# Patient Record
Sex: Female | Born: 1980 | Race: Black or African American | Hispanic: No | Marital: Single | Smoking: Former smoker
Health system: Southern US, Community
[De-identification: ages and names within clinical notes are randomized; demographics above are authoritative.]

## PROBLEM LIST (undated history)

## (undated) DIAGNOSIS — K551 Chronic vascular disorders of intestine: Secondary | ICD-10-CM

## (undated) DIAGNOSIS — Z309 Encounter for contraceptive management, unspecified: Secondary | ICD-10-CM

## (undated) DIAGNOSIS — K649 Unspecified hemorrhoids: Secondary | ICD-10-CM

## (undated) DIAGNOSIS — N898 Other specified noninflammatory disorders of vagina: Secondary | ICD-10-CM

## (undated) DIAGNOSIS — M549 Dorsalgia, unspecified: Secondary | ICD-10-CM

## (undated) DIAGNOSIS — B009 Herpesviral infection, unspecified: Principal | ICD-10-CM

## (undated) DIAGNOSIS — G709 Myoneural disorder, unspecified: Secondary | ICD-10-CM

## (undated) DIAGNOSIS — A749 Chlamydial infection, unspecified: Principal | ICD-10-CM

## (undated) DIAGNOSIS — N9489 Other specified conditions associated with female genital organs and menstrual cycle: Secondary | ICD-10-CM

## (undated) DIAGNOSIS — B9689 Other specified bacterial agents as the cause of diseases classified elsewhere: Secondary | ICD-10-CM

## (undated) DIAGNOSIS — A630 Anogenital (venereal) warts: Secondary | ICD-10-CM

## (undated) DIAGNOSIS — K5900 Constipation, unspecified: Secondary | ICD-10-CM

## (undated) DIAGNOSIS — U071 COVID-19: Secondary | ICD-10-CM

## (undated) DIAGNOSIS — R1032 Left lower quadrant pain: Principal | ICD-10-CM

## (undated) DIAGNOSIS — N760 Acute vaginitis: Secondary | ICD-10-CM

## (undated) HISTORY — DX: Other specified bacterial agents as the cause of diseases classified elsewhere: B96.89

## (undated) HISTORY — DX: Left lower quadrant pain: R10.32

## (undated) HISTORY — DX: Herpesviral infection, unspecified: B00.9

## (undated) HISTORY — DX: Acute vaginitis: N76.0

## (undated) HISTORY — DX: Anogenital (venereal) warts: A63.0

## (undated) HISTORY — DX: Chlamydial infection, unspecified: A74.9

## (undated) HISTORY — DX: Dorsalgia, unspecified: M54.9

## (undated) HISTORY — DX: Constipation, unspecified: K59.00

## (undated) HISTORY — DX: Other specified noninflammatory disorders of vagina: N89.8

## (undated) HISTORY — PX: OTHER SURGICAL HISTORY: SHX169

## (undated) HISTORY — DX: Unspecified hemorrhoids: K64.9

## (undated) HISTORY — DX: Other specified conditions associated with female genital organs and menstrual cycle: N94.89

## (undated) HISTORY — DX: Chronic vascular disorders of intestine: K55.1

## (undated) HISTORY — DX: Encounter for contraceptive management, unspecified: Z30.9

---

## 2006-04-09 ENCOUNTER — Ambulatory Visit (HOSPITAL_COMMUNITY): Admission: AD | Admit: 2006-04-09 | Discharge: 2006-04-09 | Payer: Self-pay | Admitting: Obstetrics and Gynecology

## 2006-04-22 ENCOUNTER — Inpatient Hospital Stay (HOSPITAL_COMMUNITY): Admission: AD | Admit: 2006-04-22 | Discharge: 2006-04-24 | Payer: Self-pay | Admitting: Obstetrics and Gynecology

## 2007-10-29 ENCOUNTER — Emergency Department (HOSPITAL_COMMUNITY): Admission: EM | Admit: 2007-10-29 | Discharge: 2007-10-29 | Payer: Self-pay | Admitting: Emergency Medicine

## 2007-11-06 ENCOUNTER — Encounter: Payer: Self-pay | Admitting: Physician Assistant

## 2008-06-09 ENCOUNTER — Other Ambulatory Visit: Admission: RE | Admit: 2008-06-09 | Discharge: 2008-06-09 | Payer: Self-pay | Admitting: Obstetrics and Gynecology

## 2009-06-29 ENCOUNTER — Other Ambulatory Visit: Admission: RE | Admit: 2009-06-29 | Discharge: 2009-06-29 | Payer: Self-pay | Admitting: Obstetrics and Gynecology

## 2010-08-09 ENCOUNTER — Other Ambulatory Visit: Admission: RE | Admit: 2010-08-09 | Discharge: 2010-08-09 | Payer: Self-pay | Admitting: Obstetrics and Gynecology

## 2011-02-16 NOTE — Op Note (Signed)
NAMEAHRIANNA, SIGLIN             ACCOUNT NO.:  [id number]   MEDICAL RECORD NO.:  [id number]          PATIENT TYPE:  INP   LOCATION:  LDR4                          FACILITY:  [facility]   PHYSICIAN:  Tilda Burrow, M.D. DATE OF BIRTH:  July 21, 1981   DATE OF PROCEDURE:  04/23/2006  DATE OF DISCHARGE:                                 OPERATIVE REPORT   PROCEDURE:  Epidural catheter placement.   DESCRIPTION OF PROCEDURE:  Continuous lumbar epidural catheter placed at L2-  3 interspace on first attempt using loss of resistance technique at a depth  of approximately 5 cm.  Epidural catheter threaded easily 3 cm into the  epidural space.  Continuous infusion of 14 cc per hour initiated.  Bolus was  not administered because the patient's pain level is minimal.   CONDITION POST-PROCEDURE:  Excellent fetal and maternal status.      Tilda Burrow, M.D.  Electronically Signed     JVF/MEDQ  D:  04/23/2006  T:  04/23/2006  Job:  [id number]

## 2011-02-16 NOTE — H&P (Signed)
Jacqueline [name], Jacqueline [name]             ACCOUNT NO.:  [id number]   MEDICAL RECORD NO.:  [id number]          PATIENT TYPE:  INP   LOCATION:  LDR4                          FACILITY:  [facility]   PHYSICIAN:  Tilda Burrow, M.D. DATE OF BIRTH:  06/28/1981   DATE OF ADMISSION:  DATE OF DISCHARGE:  LH                                HISTORY & PHYSICAL   ADMISSION DIAGNOSIS:  Pregnancy, 38+ weeks' gestation. Induction of labor  with cervical favorability. History of rapid labors.   HISTORY:  This 30 year old gravida 2, para 1 has been followed prepregnancy  here through 14 prenatal visits. Cervix last week was checked at 3 cm, 70%,  -1. Reexam today suggests 2 to 3 cm. Presenting part was extremely low.  Approach to the cervix was posterior. The patient requests induction of  labor. She is aware that all of the usual risks of labor can be accompanied  with induced labor including the need for emergency delivery.   PAST MEDICAL HISTORY:  Benign.   PAST SURGICAL HISTORY:  Negative.   ALLERGIES:  PENICILLIN.   SOCIAL HISTORY:  Alcohol, cigarettes and recreational drugs denied.   PRENATAL LABORATORY DATA:  Include blood type O positive, urine drug  negative, rubella immunity present, hemoglobin 11, hematocrit 35. Hepatitis,  HIV, RPR all negative. GC and chlamydia were both positive. The patient and  her partner were treated and were subsequently negative in the first  trimester and then rechecked at 35 weeks, and group B strep was negative at  that time as well. She is admitted for cervical ripening and Pitocin  induction of labor. She plans to have an epidural. Plans to breast fed,  bottle supplement and plans IUD in the future.   PHYSICAL EXAMINATION:  GENERAL:  Shows height 5 foot 11, blood pressure  138/72, weight 195. Healthy, slim, athletic appearing, African-American  female.  HEENT:  Pupils are equal, round, and reactive.  CARDIOVASCULAR:  Unremarkable.  ABDOMEN:  38-cm fundal height.  Term fetus and vertex presentation.   PLAN:  Foley bulb tonight. Pitocin in a.m.     Tilda Burrow, M.D.  Electronically Signed    JVF/MEDQ  D:  04/22/2006  T:  04/22/2006  Job:  [id number]

## 2011-02-16 NOTE — Group Therapy Note (Signed)
[name], [name]             ACCOUNT NO.:  [id number]   MEDICAL RECORD NO.:  [id number]          PATIENT TYPE:  INP   LOCATION:  LDR4                          FACILITY:  [facility]   PHYSICIAN:  Tilda Burrow, M.D. DATE OF BIRTH:  [birth year]   DATE OF PROCEDURE:  DATE OF DISCHARGE:                                   PROGRESS NOTE   Jacqueline [name] progressed very rapidly after her epidural and was noted to be fully  dilated at +3 station.  She had about a 10 minute second stage and had a  spontaneous vaginal delivery of a viable female infant at 44.  The body  delivered without difficulty.  Weight is 7 pounds 5 ounces, Apgars are 9 and  9.  The baby had a very lusty cry prior to delivery of his body which I  always find interesting.  Twenty units Pitocin diluted in 1000 mL lactated  Ringer's was infused rapidly IV. The placenta separated spontaneously and  delivered via controlled cord traction at 0851.  It was inspected and  appears to be intact with a three-vessel cord.  Blood loss was minimal.  Fundus was firm.  The epidural catheter was then removed with the blue tip  visualized as being intact.  She had her epidural for approximately one  hour.  The vagina was inspected and no lacerations were found.  Estimated  blood loss 300 mL.      [name], C.N.M.      Tilda Burrow, M.D.  Electronically Signed    FC/MEDQ  D:  04/23/2006  T:  04/23/2006  Job:  [id number]

## 2011-05-27 ENCOUNTER — Emergency Department (HOSPITAL_COMMUNITY)
Admission: EM | Admit: 2011-05-27 | Discharge: 2011-05-27 | Disposition: A | Discharge status: Home / Self Care | Payer: Medicaid Other | Attending: Emergency Medicine | Admitting: Emergency Medicine

## 2011-05-27 ENCOUNTER — Emergency Department (HOSPITAL_COMMUNITY): Payer: Medicaid Other

## 2011-05-27 DIAGNOSIS — N10 Acute tubulo-interstitial nephritis: Secondary | ICD-10-CM | POA: Insufficient documentation

## 2011-05-27 DIAGNOSIS — F17200 Nicotine dependence, unspecified, uncomplicated: Secondary | ICD-10-CM | POA: Insufficient documentation

## 2011-05-27 LAB — CBC
MCV: 80.5 fL (ref 78.0–100.0)
Platelets: 236 10*3/uL (ref 150–400)
RDW: 13.8 % (ref 11.5–15.5)
WBC: 7.7 10*3/uL (ref 4.0–10.5)

## 2011-05-27 LAB — URINALYSIS, ROUTINE W REFLEX MICROSCOPIC
Glucose, UA: NEGATIVE mg/dL
Specific Gravity, Urine: 1.02 (ref 1.005–1.030)
pH: 7 (ref 5.0–8.0)

## 2011-05-27 LAB — DIFFERENTIAL
Basophils Absolute: 0 10*3/uL (ref 0.0–0.1)
Basophils Relative: 0 % (ref 0–1)
Eosinophils Absolute: 0.1 10*3/uL (ref 0.0–0.7)
Eosinophils Relative: 1 % (ref 0–5)
Lymphocytes Relative: 13 % (ref 12–46)

## 2011-05-27 LAB — COMPREHENSIVE METABOLIC PANEL
ALT: 6 U/L (ref 0–35)
AST: 14 U/L (ref 0–37)
Albumin: 3.9 g/dL (ref 3.5–5.2)
CO2: 24 mEq/L (ref 19–32)
Calcium: 9.5 mg/dL (ref 8.4–10.5)
Sodium: 133 mEq/L — ABNORMAL LOW (ref 135–145)
Total Protein: 7.7 g/dL (ref 6.0–8.3)

## 2011-05-27 LAB — URINE MICROSCOPIC-ADD ON

## 2011-05-27 MED ORDER — ACETAMINOPHEN 325 MG PO TABS
650.0000 mg | ORAL_TABLET | Freq: Once | ORAL | Status: AC
  Administered 2011-05-27: 650 mg via ORAL
  Filled 2011-05-27: qty 2

## 2011-05-27 MED ORDER — SODIUM CHLORIDE 0.9 % IV BOLUS (SEPSIS)
1000.0000 mL | Freq: Once | INTRAVENOUS | Status: AC
  Administered 2011-05-27: 1000 mL via INTRAVENOUS

## 2011-05-27 MED ORDER — ONDANSETRON HCL 4 MG/2ML IJ SOLN
4.0000 mg | Freq: Once | INTRAMUSCULAR | Status: AC
  Administered 2011-05-27: 4 mg via INTRAVENOUS
  Filled 2011-05-27: qty 2

## 2011-05-27 MED ORDER — ONDANSETRON HCL 4 MG PO TABS: 4.0000 mg | ORAL_TABLET | Freq: Four times a day (QID) | ORAL | Status: AC

## 2011-05-27 MED ORDER — CIPROFLOXACIN IN D5W 400 MG/200ML IV SOLN
400.0000 mg | Freq: Two times a day (BID) | INTRAVENOUS | Status: DC
  Administered 2011-05-27: 400 mg via INTRAVENOUS
  Filled 2011-05-27: qty 200

## 2011-05-27 MED ORDER — MORPHINE SULFATE 4 MG/ML IJ SOLN
4.0000 mg | Freq: Once | INTRAMUSCULAR | Status: AC
  Administered 2011-05-27: 4 mg via INTRAVENOUS
  Filled 2011-05-27: qty 1

## 2011-05-27 MED ORDER — OXYCODONE-ACETAMINOPHEN 5-325 MG PO TABS: 1.0000 | ORAL_TABLET | Freq: Four times a day (QID) | ORAL | Status: AC | PRN

## 2011-05-27 MED ORDER — CIPROFLOXACIN HCL 500 MG PO TABS: 500.0000 mg | ORAL_TABLET | Freq: Two times a day (BID) | ORAL | Status: AC

## 2011-05-27 NOTE — ED Notes (Signed)
C/o chills, fever, left side abd and left side back pain, pt states that this started Friday. Denies any urinary symptoms, also c/o vaginal bleeding that is different from her regular periods.

## 2011-05-27 NOTE — ED Notes (Signed)
Pt reports abnormal "breakthrough" menstrual bleeding a few days ago.  Pt denies missing any of her birth control pills.  Pt reports this being out of the ordinary for her.

## 2011-05-27 NOTE — ED Notes (Signed)
Pt reports LUQ pain that radiates to her back, fever, fatigue, and decreased appetite since Friday.  Pt also reports dizziness when she changes positions.

## 2011-05-27 NOTE — ED Provider Notes (Signed)
History     CSN: [id number] Arrival date & time: 05/27/2011  2:31 PM  Chief Complaint  Patient presents with  . Generalized Body Aches  . Abdominal Pain  . Weakness   Patient is a 30 y.o. female presenting with abdominal pain. The history is provided by the patient.  Abdominal Pain The primary symptoms of the illness include abdominal pain and fever. The primary symptoms of the illness do not include fatigue, shortness of breath, nausea, vomiting, diarrhea, dysuria or vaginal discharge. Vaginal bleeding: the other day but it stopped yesterday. The current episode started more than 2 days ago. The onset of the illness was gradual. The problem has not changed since onset. Progression: waxing and waning at times, worse last night better in the am but increased again. The abdominal pain is located in the LUQ. The abdominal pain radiates to the left flank. Pain scale: moderate, worse at night. The abdominal pain is exacerbated by NSAIDs.  Additional symptoms associated with the illness include chills and back pain. Symptoms associated with the illness do not include constipation or urgency.    History reviewed. No pertinent past medical history.  History reviewed. No pertinent past surgical history.  No family history on file.  History  Substance Use Topics  . Smoking status: Current Everyday Smoker  . Smokeless tobacco: Not on file  . Alcohol Use: No    OB History    Grav Para Term Preterm Abortions TAB SAB Ect Mult Living                  Review of Systems  Constitutional: Positive for fever and chills. Negative for fatigue.  Respiratory: Negative for cough and shortness of breath.   Gastrointestinal: Positive for abdominal pain. Negative for nausea, vomiting, diarrhea and constipation.  Genitourinary: Negative for dysuria, urgency and vaginal discharge. Vaginal bleeding: the other day but it stopped yesterday.  Musculoskeletal: Positive for back pain.  All other systems  reviewed and are negative.    Physical Exam  BP 131/48  Pulse 117  Temp(Src) 98.8 F (37.1 C) (Oral)  Resp 20  Ht 6' (1.829 m)  Wt 173 lb (78.472 kg)  BMI 23.46 kg/m2  SpO2 100%  LMP 05/25/2011  Physical Exam  Nursing note and vitals reviewed. Constitutional: She appears well-developed and well-nourished. No distress.  HENT:  Head: Normocephalic and atraumatic.  Right Ear: External ear normal.  Left Ear: External ear normal.  Eyes: Conjunctivae are normal. Right eye exhibits no discharge. Left eye exhibits no discharge. No scleral icterus.  Neck: Neck supple. No tracheal deviation present.  Cardiovascular: Normal rate, regular rhythm and intact distal pulses.   Pulmonary/Chest: Effort normal and breath sounds normal. No stridor. No respiratory distress. She has no wheezes. She has no rales. She exhibits no tenderness.  Abdominal: Soft. Bowel sounds are normal. She exhibits no distension. There is tenderness in the left upper quadrant. There is CVA tenderness (left). There is no rigidity, no rebound and no guarding. No hernia.  Musculoskeletal: She exhibits no edema and no tenderness.  Neurological: She is alert. She has normal strength. No sensory deficit. Cranial nerve deficit:  no gross defecits noted. She exhibits normal muscle tone. She displays no seizure activity. Coordination normal.  Skin: Skin is warm and dry. No rash noted.  Psychiatric: She has a normal mood and affect.    ED Course  Procedures Dg Chest 2 View  05/27/2011  *RADIOLOGY REPORT*f  Clinical Data: Fever.  Left flank pain.f  CHEST - 2 VIEW  Comparison: None.  Findings: Normal sized heart.  Clear lungs.  Normal appearing bones.  IMPRESSION: Normal examination.  Original Report Authenticated By: Darrol Angel, M.D.   Labs Reviewed  CBC - Abnormal; Notable for the following:    Hemoglobin 11.9 (*)    HCT 35.2 (*)    All other components within normal limits  DIFFERENTIAL - Abnormal; Notable for the  following:    Neutrophils Relative 78 (*)    All other components within normal limits  COMPREHENSIVE METABOLIC PANEL - Abnormal; Notable for the following:    Sodium 133 (*)    Glucose, Bld 109 (*)    All other components within normal limits  URINALYSIS, ROUTINE W REFLEX MICROSCOPIC - Abnormal; Notable for the following:    Appearance HAZY (*)    Hgb urine dipstick TRACE (*)    Leukocytes, UA SMALL (*)    All other components within normal limits  URINE MICROSCOPIC-ADD ON - Abnormal; Notable for the following:    Squamous Epithelial / LPF FEW (*)    Bacteria, UA MANY (*)    All other components within normal limits  POCT PREGNANCY, URINE  LIPASE, BLOOD  URINE CULTURE    MDM All labs and radiology studies have been reviewed. Patient appears to have an episode of acute pyelonephritis. At this point she is feeling better after IV hydration and pain medications. IV dose of ciprofloxacin has been given.  Clinical impression: Acute pyelonephritis Plan will be to discharge her home on oral antibiotics and pain medications. She's been counseled to follow up with her doctor and return to emergency room or her doctor if not feeling better within the next couple of days and certainly if the fever does not resolve.      Celene Kras, MD 05/27/11 [phone]

## 2011-05-29 LAB — URINE CULTURE

## 2011-08-16 ENCOUNTER — Other Ambulatory Visit (HOSPITAL_COMMUNITY)
Admission: RE | Admit: 2011-08-16 | Discharge: 2011-08-16 | Disposition: A | Discharge status: Home / Self Care | Payer: Medicaid Other | Source: Ambulatory Visit | Attending: Obstetrics and Gynecology | Admitting: Obstetrics and Gynecology

## 2011-08-16 DIAGNOSIS — Z01419 Encounter for gynecological examination (general) (routine) without abnormal findings: Secondary | ICD-10-CM | POA: Insufficient documentation

## 2011-10-09 ENCOUNTER — Emergency Department: Payer: Self-pay | Admitting: Emergency Medicine

## 2011-10-09 LAB — URINALYSIS, COMPLETE
Bilirubin,UR: NEGATIVE
Glucose,UR: NEGATIVE mg/dL (ref 0–75)
Ketone: NEGATIVE
Leukocyte Esterase: NEGATIVE
Ph: 5 (ref 4.5–8.0)
Protein: NEGATIVE
RBC,UR: 1 /HPF (ref 0–5)
WBC UR: <1 /HPF (ref 0–5)

## 2011-10-09 LAB — WET PREP, GENITAL

## 2011-10-09 LAB — PREGNANCY, URINE: Pregnancy Test, Urine: NEGATIVE m[IU]/mL

## 2012-08-20 ENCOUNTER — Other Ambulatory Visit (HOSPITAL_COMMUNITY)
Admission: RE | Admit: 2012-08-20 | Discharge: 2012-08-20 | Disposition: A | Discharge status: Home / Self Care | Payer: Medicaid Other | Source: Ambulatory Visit | Attending: Obstetrics and Gynecology | Admitting: Obstetrics and Gynecology

## 2012-08-20 DIAGNOSIS — Z3049 Encounter for surveillance of other contraceptives: Secondary | ICD-10-CM | POA: Insufficient documentation

## 2012-08-20 DIAGNOSIS — Z1151 Encounter for screening for human papillomavirus (HPV): Secondary | ICD-10-CM | POA: Insufficient documentation

## 2012-08-20 DIAGNOSIS — Z113 Encounter for screening for infections with a predominantly sexual mode of transmission: Secondary | ICD-10-CM | POA: Insufficient documentation

## 2013-10-01 HISTORY — PX: ENDOSCOPIC RETROGRADE CHOLANGIOPANCREATOGRAPHY (ERCP) WITH PROPOFOL: SHX5810

## 2013-11-13 ENCOUNTER — Ambulatory Visit (INDEPENDENT_AMBULATORY_CARE_PROVIDER_SITE_OTHER): Payer: Medicaid Other | Admitting: Obstetrics and Gynecology

## 2013-11-13 ENCOUNTER — Encounter (INDEPENDENT_AMBULATORY_CARE_PROVIDER_SITE_OTHER): Payer: Self-pay

## 2013-11-13 ENCOUNTER — Telehealth: Payer: Self-pay | Admitting: *Deleted

## 2013-11-13 ENCOUNTER — Encounter: Payer: Self-pay | Admitting: Obstetrics and Gynecology

## 2013-11-13 VITALS — BP 120/80 | Ht 72.0 in | Wt 170.0 lb

## 2013-11-13 DIAGNOSIS — A6000 Herpesviral infection of urogenital system, unspecified: Secondary | ICD-10-CM | POA: Insufficient documentation

## 2013-11-13 DIAGNOSIS — R1013 Epigastric pain: Secondary | ICD-10-CM

## 2013-11-13 DIAGNOSIS — Z3202 Encounter for pregnancy test, result negative: Secondary | ICD-10-CM

## 2013-11-13 DIAGNOSIS — N898 Other specified noninflammatory disorders of vagina: Secondary | ICD-10-CM

## 2013-11-13 DIAGNOSIS — K3189 Other diseases of stomach and duodenum: Secondary | ICD-10-CM

## 2013-11-13 DIAGNOSIS — N940 Mittelschmerz: Secondary | ICD-10-CM

## 2013-11-13 DIAGNOSIS — R109 Unspecified abdominal pain: Secondary | ICD-10-CM

## 2013-11-13 LAB — POCT URINALYSIS DIPSTICK
GLUCOSE UA: NEGATIVE
Ketones, UA: NEGATIVE
Leukocytes, UA: NEGATIVE
Nitrite, UA: NEGATIVE
RBC UA: NEGATIVE

## 2013-11-13 LAB — POCT WET PREP (WET MOUNT)

## 2013-11-13 LAB — POCT URINE PREGNANCY: Preg Test, Ur: NEGATIVE

## 2013-11-13 LAB — HCG, SERUM, QUALITATIVE: Preg, Serum: NEGATIVE

## 2013-11-13 NOTE — Progress Notes (Signed)
This chart was scribed by Bennett Scrapehristina Taylor, Medical Scribe, for Dr. Christin BachJohn Dayna Alia on 11/13/13 at 11:22 AM. This chart was reviewed by Dr. Christin BachJohn Serrita Lueth and is accurate.    [facility] Visit  Patient name: Jacqueline [name] MRN [id number]  Date of birth: 10-23-1980  CC & HPI:  Jacqueline [name] is a 33 y.o. female presenting today for 3 months of worsening intermittent abdominal pain. She reports [facility] [place] [facility] [place] but had [facility] and [facility] States HTN resolved after pregnancy. Reports that she finished breast feeding her newborn when she felt her "uterus contract" and the pain shot down her left arm making her legs felt weak yesterday. Reports feeling pressure in the suprapubic region with standing today. Pain not always triggered by breast feeding. States she constantly breast feeds. G3P3A0. Did not breast feed prior children. H/x herpes. Was treated with suppressants during start of last pregnancy. Reports having a mild "outbreak" currently. States outbreaks are in frequent, last episode was 8 months ago. Not usually treated with medications. No current contraceptive management. One sexual encounter since birth with long term partner last week. No recent STD infections like trichomonas or gonorrhea/chylmydia (last infection was 5 years ago). Reports BV several months before start of last pregnancy. Interested in depo shot.  ROS:  + intermittent abdominal pain, sharp Denies any urinary complaints Denies fevers, chills Denies vaginal discharge or odor   Pertinent History Reviewed:  Medical & Surgical Hx:  Reviewed: Significant for herpes, suppressant medication used for start of pregnancy, currently unmedicated Medications: Reviewed & Updated - see associated section Social History: Reviewed -  reports that she has been smoking.  She does not have any smokeless tobacco history on file.  Objective Findings:  Vitals:  BP 120/80  Ht 6' (1.829 m)  Wt 170 lb (77.111 kg)  BMI 23.05 kg/m2  LMP 10/18/2013 Chaperone present for exam. Exam performed with pt's permission without severe discomfort or complications Physical Examination: General appearance - alert, well appearing, and in no distress and oriented to person, place, and time Mental status - alert, oriented to person, place, and time, normal mood, behavior, speech, dress, motor activity, and thought processes Pelvic - VULVA: normal appearing vulva with no masses, tenderness or lesions, VAGINA: normal appearing vagina with normal color and discharge, no lesions, CERVIX: normal appearing cervix without discharge or lesions, clear mucus Good anterior support Wet prep: rare clue cells, rare sperm cells    Assessment & Plan:  Pain is ovulation related. HcG serum today, depo injection in 3 days

## 2013-11-13 NOTE — Patient Instructions (Signed)

## 2013-11-13 NOTE — Telephone Encounter (Signed)
Depo Provera 150 mg disp 1 vial for IM injection in office every 3 months with 3 refills per Dr. Emel[facility]erguson [facility]. Pt aware. JSY

## 2013-11-16 ENCOUNTER — Ambulatory Visit (INDEPENDENT_AMBULATORY_CARE_PROVIDER_SITE_OTHER): Payer: Medicaid Other | Admitting: Adult Health

## 2013-11-16 ENCOUNTER — Encounter: Payer: Self-pay | Admitting: Adult Health

## 2013-11-16 ENCOUNTER — Telehealth: Payer: Self-pay

## 2013-11-16 ENCOUNTER — Encounter: Payer: Self-pay | Admitting: Obstetrics and Gynecology

## 2013-11-16 VITALS — BP 120/76 | Ht 72.0 in | Wt 170.0 lb

## 2013-11-16 DIAGNOSIS — Z3049 Encounter for surveillance of other contraceptives: Secondary | ICD-10-CM

## 2013-11-16 DIAGNOSIS — Z3202 Encounter for pregnancy test, result negative: Secondary | ICD-10-CM

## 2013-11-16 DIAGNOSIS — Z309 Encounter for contraceptive management, unspecified: Secondary | ICD-10-CM

## 2013-11-16 DIAGNOSIS — Z3200 Encounter for pregnancy test, result unknown: Secondary | ICD-10-CM

## 2013-11-16 LAB — POCT URINE PREGNANCY: Preg Test, Ur: NEGATIVE

## 2013-11-16 MED ORDER — METRONIDAZOLE 500 MG PO TABS
500.0000 mg | ORAL_TABLET | Freq: Two times a day (BID) | ORAL | Status: DC
Start: 2013-11-16 — End: 2013-12-27

## 2013-11-16 MED ORDER — MEDROXYPROGESTERONE ACETATE 150 MG/ML IM SUSP
150.0000 mg | Freq: Once | INTRAMUSCULAR | Status: AC
  Administered 2013-11-16: 150 mg via INTRAMUSCULAR

## 2013-11-16 NOTE — Telephone Encounter (Signed)
RX Metrogel for Bv called in to pharmacy

## 2013-11-19 ENCOUNTER — Telehealth: Payer: Self-pay | Admitting: Obstetrics and Gynecology

## 2013-11-19 NOTE — Telephone Encounter (Signed)
Pt states Dr. Emelda FearFerguson was to e-scribed RX for BV. Pt informed RX for Flagyl e-scribed 11/16/13 to [facility].

## 2013-12-27 ENCOUNTER — Emergency Department (HOSPITAL_COMMUNITY): Payer: Medicaid Other

## 2013-12-27 ENCOUNTER — Inpatient Hospital Stay (HOSPITAL_COMMUNITY)
Admission: EM | Admit: 2013-12-27 | Discharge: 2013-12-31 | DRG: 418 | Disposition: A | Discharge status: Home / Self Care | Payer: Medicaid Other | Attending: General Surgery | Admitting: General Surgery

## 2013-12-27 ENCOUNTER — Inpatient Hospital Stay (HOSPITAL_COMMUNITY): Payer: Medicaid Other

## 2013-12-27 ENCOUNTER — Encounter (HOSPITAL_COMMUNITY): Payer: Self-pay | Admitting: Emergency Medicine

## 2013-12-27 DIAGNOSIS — R109 Unspecified abdominal pain: Secondary | ICD-10-CM | POA: Diagnosis present

## 2013-12-27 DIAGNOSIS — K8064 Calculus of gallbladder and bile duct with chronic cholecystitis without obstruction: Principal | ICD-10-CM | POA: Diagnosis present

## 2013-12-27 DIAGNOSIS — R748 Abnormal levels of other serum enzymes: Secondary | ICD-10-CM | POA: Diagnosis present

## 2013-12-27 DIAGNOSIS — R319 Hematuria, unspecified: Secondary | ICD-10-CM | POA: Diagnosis present

## 2013-12-27 DIAGNOSIS — F17200 Nicotine dependence, unspecified, uncomplicated: Secondary | ICD-10-CM | POA: Diagnosis present

## 2013-12-27 DIAGNOSIS — F3289 Other specified depressive episodes: Secondary | ICD-10-CM | POA: Diagnosis present

## 2013-12-27 DIAGNOSIS — F329 Major depressive disorder, single episode, unspecified: Secondary | ICD-10-CM | POA: Diagnosis present

## 2013-12-27 DIAGNOSIS — K8060 Calculus of gallbladder and bile duct with cholecystitis, unspecified, without obstruction: Principal | ICD-10-CM | POA: Diagnosis present

## 2013-12-27 DIAGNOSIS — R17 Unspecified jaundice: Secondary | ICD-10-CM | POA: Diagnosis present

## 2013-12-27 DIAGNOSIS — K8050 Calculus of bile duct without cholangitis or cholecystitis without obstruction: Secondary | ICD-10-CM

## 2013-12-27 HISTORY — DX: Myoneural disorder, unspecified: G70.9

## 2013-12-27 LAB — URINALYSIS, ROUTINE W REFLEX MICROSCOPIC
GLUCOSE, UA: NEGATIVE mg/dL
Ketones, ur: NEGATIVE mg/dL
LEUKOCYTES UA: NEGATIVE
Nitrite: NEGATIVE
PH: 7 (ref 5.0–8.0)
Protein, ur: 30 mg/dL — AB
Specific Gravity, Urine: 1.025 (ref 1.005–1.030)
Urobilinogen, UA: 1 mg/dL (ref 0.0–1.0)

## 2013-12-27 LAB — URINE MICROSCOPIC-ADD ON

## 2013-12-27 LAB — COMPREHENSIVE METABOLIC PANEL
ALT: 466 U/L — ABNORMAL HIGH (ref 0–35)
AST: 598 U/L — ABNORMAL HIGH (ref 0–37)
Albumin: 4.4 g/dL (ref 3.5–5.2)
Alkaline Phosphatase: 234 U/L — ABNORMAL HIGH (ref 39–117)
BUN: 12 mg/dL (ref 6–23)
CO2: 28 mEq/L (ref 19–32)
Calcium: 9.9 mg/dL (ref 8.4–10.5)
Chloride: 100 mEq/L (ref 96–112)
Creatinine, Ser: 0.66 mg/dL (ref 0.50–1.10)
GFR calc Af Amer: >90 mL/min (ref >90)
GFR calc non Af Amer: >90 mL/min (ref >90)
Glucose, Bld: 90 mg/dL (ref 70–99)
Potassium: 3.3 mEq/L — ABNORMAL LOW (ref 3.7–5.3)
SODIUM: 140 meq/L (ref 137–147)
TOTAL PROTEIN: 8.4 g/dL — AB (ref 6.0–8.3)
Total Bilirubin: 1.9 mg/dL — ABNORMAL HIGH (ref 0.3–1.2)

## 2013-12-27 LAB — CBC WITH DIFFERENTIAL/PLATELET
BASOS ABS: 0 10*3/uL (ref 0.0–0.1)
Basophils Relative: 0 % (ref 0–1)
EOS ABS: 0.3 10*3/uL (ref 0.0–0.7)
Eosinophils Relative: 5 % (ref 0–5)
HCT: 35.5 % — ABNORMAL LOW (ref 36.0–46.0)
Hemoglobin: 11.9 g/dL — ABNORMAL LOW (ref 12.0–15.0)
Lymphocytes Relative: 39 % (ref 12–46)
Lymphs Abs: 2.5 10*3/uL (ref 0.7–4.0)
MCH: 26.5 pg (ref 26.0–34.0)
MCHC: 33.5 g/dL (ref 30.0–36.0)
MCV: 79.1 fL (ref 78.0–100.0)
Monocytes Absolute: 0.8 10*3/uL (ref 0.1–1.0)
Monocytes Relative: 12 % (ref 3–12)
NEUTROS PCT: 44 % (ref 43–77)
Neutro Abs: 2.8 10*3/uL (ref 1.7–7.7)
PLATELETS: 315 10*3/uL (ref 150–400)
RBC: 4.49 MIL/uL (ref 3.87–5.11)
RDW: 14.1 % (ref 11.5–15.5)
WBC: 6.3 10*3/uL (ref 4.0–10.5)

## 2013-12-27 LAB — PREGNANCY, URINE: PREG TEST UR: NEGATIVE

## 2013-12-27 LAB — LIPASE, BLOOD: LIPASE: 24 U/L (ref 11–59)

## 2013-12-27 LAB — BILIRUBIN, DIRECT: BILIRUBIN DIRECT: 1.7 mg/dL — AB (ref 0.0–0.3)

## 2013-12-27 MED ORDER — METRONIDAZOLE IN NACL 5-0.79 MG/ML-% IV SOLN
500.0000 mg | Freq: Once | INTRAVENOUS | Status: AC
  Administered 2013-12-27: 500 mg via INTRAVENOUS
  Filled 2013-12-27: qty 100

## 2013-12-27 MED ORDER — FLUOXETINE HCL 20 MG PO CAPS
20.0000 mg | ORAL_CAPSULE | Freq: Every day | ORAL | Status: DC
  Administered 2013-12-27 – 2013-12-31 (×5): 20 mg via ORAL
  Filled 2013-12-27 (×5): qty 1

## 2013-12-27 MED ORDER — SODIUM CHLORIDE 0.9 % IV SOLN
INTRAVENOUS | Status: DC
  Administered 2013-12-27 – 2013-12-30 (×3): via INTRAVENOUS

## 2013-12-27 MED ORDER — HYDROMORPHONE HCL PF 1 MG/ML IJ SOLN
1.0000 mg | INTRAMUSCULAR | Status: DC | PRN
  Administered 2013-12-27 – 2013-12-30 (×9): 1 mg via INTRAVENOUS
  Filled 2013-12-27 (×9): qty 1

## 2013-12-27 MED ORDER — DEXTROSE 5 % IV SOLN
1.0000 g | Freq: Once | INTRAVENOUS | Status: AC
  Administered 2013-12-27: 1 g via INTRAVENOUS
  Filled 2013-12-27: qty 10

## 2013-12-27 MED ORDER — IOHEXOL 300 MG/ML  SOLN
100.0000 mL | Freq: Once | INTRAMUSCULAR | Status: AC | PRN
  Administered 2013-12-27: 100 mL via INTRAVENOUS

## 2013-12-27 MED ORDER — PANTOPRAZOLE SODIUM 40 MG IV SOLR
40.0000 mg | INTRAVENOUS | Status: DC
  Administered 2013-12-27 – 2013-12-28 (×2): 40 mg via INTRAVENOUS
  Filled 2013-12-27 (×2): qty 40

## 2013-12-27 MED ORDER — ACETAMINOPHEN 325 MG PO TABS
650.0000 mg | ORAL_TABLET | Freq: Four times a day (QID) | ORAL | Status: DC | PRN
  Administered 2013-12-27: 650 mg via ORAL
  Filled 2013-12-27: qty 2

## 2013-12-27 MED ORDER — MORPHINE SULFATE 4 MG/ML IJ SOLN
4.0000 mg | Freq: Once | INTRAMUSCULAR | Status: AC
  Administered 2013-12-27: 4 mg via INTRAVENOUS
  Filled 2013-12-27: qty 1

## 2013-12-27 MED ORDER — ONDANSETRON HCL 4 MG/2ML IJ SOLN
4.0000 mg | Freq: Four times a day (QID) | INTRAMUSCULAR | Status: DC | PRN
  Administered 2013-12-27: 4 mg via INTRAVENOUS
  Filled 2013-12-27 (×3): qty 2

## 2013-12-27 MED ORDER — ONDANSETRON HCL 4 MG/2ML IJ SOLN
4.0000 mg | Freq: Four times a day (QID) | INTRAMUSCULAR | Status: DC | PRN
  Administered 2013-12-28 – 2013-12-30 (×5): 4 mg via INTRAVENOUS
  Filled 2013-12-27 (×3): qty 2

## 2013-12-27 MED ORDER — ESCITALOPRAM OXALATE 10 MG PO TABS
20.0000 mg | ORAL_TABLET | Freq: Every day | ORAL | Status: DC
  Administered 2013-12-28 – 2013-12-31 (×2): 20 mg via ORAL
  Filled 2013-12-27 (×5): qty 2

## 2013-12-27 MED ORDER — SODIUM CHLORIDE 0.9 % IV SOLN: INTRAVENOUS | Status: AC

## 2013-12-27 MED ORDER — ONDANSETRON HCL 4 MG/2ML IJ SOLN
4.0000 mg | Freq: Once | INTRAMUSCULAR | Status: AC
  Administered 2013-12-27: 4 mg via INTRAVENOUS
  Filled 2013-12-27: qty 2

## 2013-12-27 MED ORDER — METRONIDAZOLE IN NACL 5-0.79 MG/ML-% IV SOLN
500.0000 mg | Freq: Three times a day (TID) | INTRAVENOUS | Status: DC
  Administered 2013-12-27 – 2013-12-30 (×9): 500 mg via INTRAVENOUS
  Filled 2013-12-27 (×9): qty 100

## 2013-12-27 MED ORDER — MORPHINE SULFATE 4 MG/ML IJ SOLN
4.0000 mg | INTRAMUSCULAR | Status: DC | PRN
  Administered 2013-12-29 – 2013-12-31 (×2): 4 mg via INTRAVENOUS
  Filled 2013-12-27 (×3): qty 1

## 2013-12-27 MED ORDER — IOHEXOL 300 MG/ML  SOLN
50.0000 mL | Freq: Once | INTRAMUSCULAR | Status: AC | PRN
  Administered 2013-12-27: 50 mL via ORAL

## 2013-12-27 MED ORDER — CIPROFLOXACIN IN D5W 400 MG/200ML IV SOLN
400.0000 mg | Freq: Two times a day (BID) | INTRAVENOUS | Status: DC
  Administered 2013-12-27 – 2013-12-31 (×9): 400 mg via INTRAVENOUS
  Filled 2013-12-27 (×9): qty 200

## 2013-12-27 NOTE — ED Provider Notes (Signed)
CSN: [id number]     Arrival date & time 12/27/13  0034 History   First MD Initiated Contact with Patient 12/27/13 0147     Chief Complaint  Patient presents with  . Abdominal Pain     HPI Patient reports intermittent upper abdominal pain over the past several months since the birth of her child 5 months ago.  No prior history of gallstones.  She reports that today the pain became worse and now her pain is constant located on the right side of her abdomen.  She's had associated nausea and vomiting.  She denies fevers and chills.  She reports her pain is moderate in severity at this time.  No urinary complaints.  No chest pain or shortness of breath.  Her pain is worse by palpation of the right upper quadrant upper ou   Past Medical History  Diagnosis Date  . Herpes simplex without mention of complication    History reviewed. No pertinent past surgical history. No family history on file. History  Substance Use Topics  . Smoking status: Current Every Day Smoker  . Smokeless tobacco: Not on file  . Alcohol Use: No   OB History   Grav Para Term Preterm Abortions TAB SAB Ect Mult Living   3 3             Review of Systems  All other systems reviewed and are negative.      Allergies  Penicillins  Home Medications   Current Outpatient Rx  Name  Route  Sig  Dispense  Refill  . cholecalciferol (VITAMIN D) 1000 UNITS tablet   Oral   Take 1,000 Units by mouth daily.         Marland Kitchen. escitalopram (LEXAPRO) 20 MG tablet   Oral   Take 20 mg by mouth daily.         . ferrous sulfate 325 (65 FE) MG tablet   Oral   Take 325 mg by mouth daily with breakfast.         . FLUoxetine (PROZAC) 20 MG capsule   Oral   Take 20 mg by mouth daily.         . metroNIDAZOLE (FLAGYL) 500 MG tablet   Oral   Take 1 tablet (500 mg total) by mouth 2 (two) times daily.   14 tablet   0    BP 139/79  Pulse 77  Temp(Src) 98 F (36.7 C) (Oral)  Resp 20  Ht 6' (1.829 m)  Wt 174 lb  (78.926 kg)  BMI 23.59 kg/m2  SpO2 99%  LMP 12/13/2013 Physical Exam  Nursing note and vitals reviewed. Constitutional: She is oriented to person, place, and time. She appears well-developed and well-nourished. No distress.  HENT:  Head: Normocephalic and atraumatic.  Eyes: EOM are normal.  Neck: Normal range of motion.  Cardiovascular: Normal rate, regular rhythm and normal heart sounds.   Pulmonary/Chest: Effort normal and breath sounds normal.  Abdominal: Soft. She exhibits no distension.  Right upper quadrant tenderness without guarding or rebound  Musculoskeletal: Normal range of motion.  Neurological: She is alert and oriented to person, place, and time.  Skin: Skin is warm and dry.  Psychiatric: She has a normal mood and affect. Judgment normal.    ED Course  Procedures (including critical care time) Labs Review Labs Reviewed  URINALYSIS, ROUTINE W REFLEX MICROSCOPIC - Abnormal; Notable for the following:    Hgb urine dipstick TRACE (*)    Bilirubin Urine MODERATE (*)  Protein, ur 30 (*)    All other components within normal limits  CBC WITH DIFFERENTIAL - Abnormal; Notable for the following:    Hemoglobin 11.9 (*)    HCT 35.5 (*)    All other components within normal limits  COMPREHENSIVE METABOLIC PANEL - Abnormal; Notable for the following:    Potassium 3.3 (*)    Total Protein 8.4 (*)    AST 598 (*)    ALT 466 (*)    Alkaline Phosphatase 234 (*)    Total Bilirubin 1.9 (*)    All other components within normal limits  URINE MICROSCOPIC-ADD ON - Abnormal; Notable for the following:    Bacteria, UA FEW (*)    All other components within normal limits  PREGNANCY, URINE  LIPASE, BLOOD   Imaging Review Ct Abdomen Pelvis W Contrast  12/27/2013   CLINICAL DATA:  Right upper quadrant pain, elevated liver function tests.  EXAM: CT ABDOMEN AND PELVIS WITH CONTRAST  TECHNIQUE: Multidetector CT imaging of the abdomen and pelvis was performed using the standard  protocol following bolus administration of intravenous contrast.  CONTRAST:  50mL OMNIPAQUE IOHEXOL 300 MG/ML SOLN, OMNIPAQUE IOHEXOL 300 MG/ML SOLN  COMPARISON:  None available for comparison at time of study interpretation.  FINDINGS: Included view of the lung bases are clear. Visualized heart and pericardium are unremarkable.  The liver, spleen, pancreas and adrenal glands are unremarkable.  Gastric antral wall thickening and possible edema, with adjacent gallbladder which shows trace pericholecystic fluid (coronal 31/92). Subcentimeter linear calcifications in the gallbladder may be within the wall or reflect gallstones.  The stomach, small and large bowel are normal in course and caliber without inflammatory changes. Normal appendix. No intraperitoneal free fluid nor free air.  Kidneys are orthotopic, demonstrating symmetric enhancement without nephrolithiasis, hydronephrosis or renal masses. The unopacified ureters are normal in course and caliber. Delayed imaging through the kidneys demonstrates symmetric prompt excretion to the proximal urinary collecting system. Urinary bladder is partially distended and unremarkable.  Great vessels are normal in course and caliber. No lymphadenopathy by CT size criteria. Thickened appearance of the endometrium may reflect secretory phase. The soft tissues and included osseous structures are nonsuspicious.  IMPRESSION: Gastric antral wall thickening could reflect gastritis, however there is associated inflammatory changes of the subjacent gallbladder with trace pericholecystic fluid. If clinical concern for acute cholecystitis, consider right upper quadrant ultrasound.   Electronically Signed   By: Awilda Metro   On: 12/27/2013 05:51  I personally reviewed the imaging tests through PACS system I reviewed available ER/hospitalization records through the EMR    EKG Interpretation None      MDM   Final diagnoses:  Abdominal pain  Elevated liver enzymes     Patient with elevated liver enzymes and some right upper quadrant tenderness.  CT demonstrates possible thickening of the gallbladder wall with some possible pericholecystic fluid.  Patient be started on antibiotics at this time for the possibility of developing cholecystitis.  Patient with significant elevation in her liver enzymes and alkaline phosphatase.  This also could represent common bile duct stone.  Patient will need an ultrasound in the next several hours when ultrasound becomes available.  I spoke with general surgery Dr. Lovell Sheehan who will see the patient in consultation.  He requests the patient be admitted to the hospital by the hospitalist.    Lyanne Co, MD 12/27/13 [phone]

## 2013-12-27 NOTE — ED Notes (Signed)
Pt reporting generalized abdominal pain.  Reports she has had pain for about 5 months since birth of child, but reporting pain worse in past 3 days. Also reporting nausea and vomiting.  Denies urinary symptoms at this time.

## 2013-12-27 NOTE — Progress Notes (Signed)
Utilization review Completed Lavilla Delamora RN BSN   

## 2013-12-27 NOTE — H&P (Signed)
[facility] History and Physical  Jacqueline SpiesShikea L Gene NFA:[id number] DOB: [birth year] DOA: 12/27/2013  Referring physician: EDP PCP: No PCP Per Patient   Chief Complaint: Abdominal pain  HPI: Jacqueline [name] is a 33 y.o. female with PMH of Depression, who is 6months post partum, has had intermittent R sided abd pain for 5-66months, this became worse in the last 4-5days, associated with nausea and vomiting. She also reports low grade fevers. In ER, noted to have Abnormal LFTS and CT with some gall bladder wall thickening.  Review of Systems:  Constitutional:  No weight loss, night sweats, Fevers, chills, fatigue.  HEENT:  No headaches, Difficulty swallowing,Tooth/dental problems,Sore throat,  No sneezing, itching, ear ache, nasal congestion, post nasal drip,  Cardio-vascular:  No chest pain, Orthopnea, PND, swelling in lower extremities, anasarca, dizziness, palpitations  GI:  No heartburn, indigestion, abdominal pain, nausea, vomiting, diarrhea, change in bowel habits, loss of appetite  Resp:  No shortness of breath with exertion or at rest. No excess mucus, no productive cough, No non-productive cough, No coughing up of blood.No change in color of mucus.No wheezing.No chest wall deformity  Skin:  no rash or lesions.  GU:  no dysuria, change in color of urine, no urgency or frequency. No flank pain.  Musculoskeletal:  No joint pain or swelling. No decreased range of motion. No back pain.  Psych:  No change in mood or affect. No depression or anxiety. No memory loss.   Past Medical History  Diagnosis Date  . Herpes simplex without mention of complication    History reviewed. No pertinent past surgical history. Social History:  reports that she has been smoking.  She does not have any smokeless tobacco history on file. She reports that she does not drink alcohol or use illicit drugs.  Allergies  Allergen Reactions  . Penicillins Shortness Of Breath and Rash    History  reviewed. No pertinent family history.   Prior to Admission medications   Medication Sig Start Date End Date Taking? Authorizing Provider  cholecalciferol (VITAMIN D) 1000 UNITS tablet Take 1,000 Units by mouth daily.   Yes Historical Provider, MD  escitalopram (LEXAPRO) 20 MG tablet Take 20 mg by mouth daily.   Yes Historical Provider, MD  ferrous sulfate 325 (65 FE) MG tablet Take 325 mg by mouth daily with breakfast.   Yes Historical Provider, MD  FLUoxetine (PROZAC) 20 MG capsule Take 20 mg by mouth daily.   Yes Historical Provider, MD  metroNIDAZOLE (FLAGYL) 500 MG tablet Take 1 tablet (500 mg total) by mouth 2 (two) times daily. 11/16/13   Tilda BurrowJohn V Ferguson, MD   Physical Exam: Filed Vitals:   12/27/13 0737  BP: 127/58  Pulse: 60  Temp: 97.9 F (36.6 C)  Resp:     BP 127/58  Pulse 60  Temp(Src) 97.9 F (36.6 C) (Oral)  Resp 16  Ht 6' (1.829 m)  Wt 76.2 kg (167 lb 15.9 oz)  BMI 22.78 kg/m2  SpO2 100%  LMP 12/13/2013  General:  Appears calm and comfortable Eyes: PERRL, normal lids, irises & conjunctiva ENT: grossly normal hearing, lips & tongue Neck: no LAD, masses or thyromegaly Cardiovascular: RRR, no m/r/g. No LE edema. Respiratory: CTA bilaterally, no w/r/r. Normal respiratory effort. Abdomen: soft, Mild RUQ tenderness, BS diminished Skin: no rash or induration seen on limited exam Musculoskeletal: grossly normal tone BUE/BLE Psychiatric: grossly normal mood and affect, speech fluent and appropriate Neurologic: grossly non-focal.  Labs on Admission:  Basic Metabolic Panel:  Recent Labs Lab 12/27/13 0154  NA 140  K 3.3*  CL 100  CO2 28  GLUCOSE 90  BUN 12  CREATININE 0.66  CALCIUM 9.9   Liver Function Tests:  Recent Labs Lab 12/27/13 0154  AST 598*  ALT 466*  ALKPHOS 234*  BILITOT 1.9*  PROT 8.4*  ALBUMIN 4.4    Recent Labs Lab 12/27/13 0154  LIPASE 24   No results found for this basename: AMMONIA,  in the last 168  hours CBC:  Recent Labs Lab 12/27/13 0154  WBC 6.3  NEUTROABS 2.8  HGB 11.9*  HCT 35.5*  MCV 79.1  PLT 315   Cardiac Enzymes: No results found for this basename: CKTOTAL, CKMB, CKMBINDEX, TROPONINI,  in the last 168 hours  BNP (last 3 results) No results found for this basename: PROBNP,  in the last 8760 hours CBG: No results found for this basename: GLUCAP,  in the last 168 hours  Radiological Exams on Admission: Ct Abdomen Pelvis W Contrast  12/27/2013   CLINICAL DATA:  Right upper quadrant pain, elevated liver function tests.  EXAM: CT ABDOMEN AND PELVIS WITH CONTRAST  TECHNIQUE: Multidetector CT imaging of the abdomen and pelvis was performed using the standard protocol following bolus administration of intravenous contrast.  CONTRAST:  50mL OMNIPAQUE IOHEXOL 300 MG/ML SOLN, OMNIPAQUE IOHEXOL 300 MG/ML SOLN  COMPARISON:  None available for comparison at time of study interpretation.  FINDINGS: Included view of the lung bases are clear. Visualized heart and pericardium are unremarkable.  The liver, spleen, pancreas and adrenal glands are unremarkable.  Gastric antral wall thickening and possible edema, with adjacent gallbladder which shows trace pericholecystic fluid (coronal 31/92). Subcentimeter linear calcifications in the gallbladder may be within the wall or reflect gallstones.  The stomach, small and large bowel are normal in course and caliber without inflammatory changes. Normal appendix. No intraperitoneal free fluid nor free air.  Kidneys are orthotopic, demonstrating symmetric enhancement without nephrolithiasis, hydronephrosis or renal masses. The unopacified ureters are normal in course and caliber. Delayed imaging through the kidneys demonstrates symmetric prompt excretion to the proximal urinary collecting system. Urinary bladder is partially distended and unremarkable.  Great vessels are normal in course and caliber. No lymphadenopathy by CT size criteria. Thickened  appearance of the endometrium may reflect secretory phase. The soft tissues and included osseous structures are nonsuspicious.  IMPRESSION: Gastric antral wall thickening could reflect gastritis, however there is associated inflammatory changes of the subjacent gallbladder with trace pericholecystic fluid. If clinical concern for acute cholecystitis, consider right upper quadrant ultrasound.   Electronically Signed   By: Awilda Metro   On: 12/27/2013 05:51   US Abdomen Limited Ruq  12/27/2013   CLINICAL DATA:  Abnormal gallbladder on CT.  EXAM: US ABDOMEN LIMITED - RIGHT UPPER QUADRANT  COMPARISON:  CT ABD - PELV W/ CM dated 12/27/2013  FINDINGS: Gallbladder:  Multiple mobile echogenic shadowing foci fill much of the gallbladder, likely stones. Gallbladder wall is thickened at 4 mm. Negative sonographic Murphy's. No pericholecystic fluid.  Common bile duct:  Diameter: Only a small portion of the common bile duct can be visualized and is normal caliber at 3 mm.  Liver:  No focal lesion identified. Within normal limits in parenchymal echogenicity.  IMPRESSION: Multiple shadowing gallstones fill much of the gallbladder lumen. Associated gallbladder wall thickening without sonographic Murphy sign may reflect chronic cholecystitis changes.   Electronically Signed   By: Charlett Nose  M.D.   On: 12/27/2013 10:27    Assessment/Plan  1. Abdominal pain -suspect Acute vs Chronic calculous cholecystitis/cholelithiasis -Surgery consult -NPO, IVF, IV CIpro/Flagyl -supportive care -check RUQ USG  2. Depression -continue anti-depressants  DVT proph: SCDS pending surgery  Code Status:Full Code Family Communication: d/w pt and family at bedside Disposition Plan: inpatient  Time spent:  [employer] [facility] Pager [phone]

## 2013-12-27 NOTE — Consult Note (Signed)
Reason for Consult: Elevated liver enzyme tests, cholelithiasis Referring Physician: Hospitalist  Jacqueline [name] is an 33 y.o. female.  HPI: Patient is a 33 year old black female who is 5 months postpartum who presents with an episode of right upper quadrant abdominal pain and nausea. She was found to have a liver transaminase elevations. CT scan of the abdomen revealed cholelithiasis with possible pericholecystic fluid. Ultrasound the gallbladder done soon after her admission reveals cholelithiasis with a normal common bile duct. She states she did not have any significant abnormalities during her pregnancy. She delivered vaginally at [facility]. She denies any fever, chills, or jaundice. She has been breast-feeding.  Past Medical History  Diagnosis Date  . Herpes simplex without mention of complication     History reviewed. No pertinent past surgical history.  History reviewed. No pertinent family history.  Social History:  reports that she has been smoking.  She does not have any smokeless tobacco history on file. She reports that she does not drink alcohol or use illicit drugs.  Allergies:  Allergies  Allergen Reactions  . Penicillins Shortness Of Breath and Rash    Medications: I have reviewed the patient's current medications.  Results for orders placed during the hospital encounter of 12/27/13 (from the past 48 hour(s))  URINALYSIS, ROUTINE W REFLEX MICROSCOPIC     Status: Abnormal   Collection Time    12/27/13  1:35 AM      Result Value Ref Range   Color, Urine YELLOW  YELLOW   APPearance CLEAR  CLEAR   Specific Gravity, Urine 1.025  1.005 - 1.030   pH 7.0  5.0 - 8.0   Glucose, UA NEGATIVE  NEGATIVE mg/dL   Hgb urine dipstick TRACE (*) NEGATIVE   Bilirubin Urine MODERATE (*) NEGATIVE   Ketones, ur NEGATIVE  NEGATIVE mg/dL   Protein, ur 30 (*) NEGATIVE mg/dL   Urobilinogen, UA 1.0  0.0 - 1.0 mg/dL   Nitrite NEGATIVE  NEGATIVE   Leukocytes, UA NEGATIVE  NEGATIVE   PREGNANCY, URINE     Status: None   Collection Time    12/27/13  1:35 AM      Result Value Ref Range   Preg Test, Ur NEGATIVE  NEGATIVE  URINE MICROSCOPIC-ADD ON     Status: Abnormal   Collection Time    12/27/13  1:35 AM      Result Value Ref Range   Squamous Epithelial / LPF RARE  RARE   WBC, UA 0-2  <3 WBC/hpf   RBC / HPF 3-6  <3 RBC/hpf   Bacteria, UA FEW (*) RARE   Urine-Other MUCOUS PRESENT    CBC WITH DIFFERENTIAL     Status: Abnormal   Collection Time    12/27/13  1:54 AM      Result Value Ref Range   WBC 6.3  4.0 - 10.5 K/uL   RBC 4.49  3.87 - 5.11 MIL/uL   Hemoglobin 11.9 (*) 12.0 - 15.0 g/dL   HCT 35.5 (*) 36.0 - 46.0 %   MCV 79.1  78.0 - 100.0 fL   MCH 26.5  26.0 - 34.0 pg   MCHC 33.5  30.0 - 36.0 g/dL   RDW 14.1  11.5 - 15.5 %   Platelets 315  150 - 400 K/uL   Neutrophils Relative % 44  43 - 77 %   Neutro Abs 2.8  1.7 - 7.7 K/uL   Lymphocytes Relative 39  12 - 46 %   Lymphs Abs 2.5  0.7 - 4.0 K/uL   Monocytes Relative 12  3 - 12 %   Monocytes Absolute 0.8  0.1 - 1.0 K/uL   Eosinophils Relative 5  0 - 5 %   Eosinophils Absolute 0.3  0.0 - 0.7 K/uL   Basophils Relative 0  0 - 1 %   Basophils Absolute 0.0  0.0 - 0.1 K/uL  COMPREHENSIVE METABOLIC PANEL     Status: Abnormal   Collection Time    12/27/13  1:54 AM      Result Value Ref Range   Sodium 140  137 - 147 mEq/L   Potassium 3.3 (*) 3.7 - 5.3 mEq/L   Chloride 100  96 - 112 mEq/L   CO2 28  19 - 32 mEq/L   Glucose, Bld 90  70 - 99 mg/dL   BUN 12  6 - 23 mg/dL   Creatinine, Ser 0.66  0.50 - 1.10 mg/dL   Calcium 9.9  8.4 - 10.5 mg/dL   Total Protein 8.4 (*) 6.0 - 8.3 g/dL   Albumin 4.4  3.5 - 5.2 g/dL   AST 598 (*) 0 - 37 U/L   ALT 466 (*) 0 - 35 U/L   Alkaline Phosphatase 234 (*) 39 - 117 U/L   Total Bilirubin 1.9 (*) 0.3 - 1.2 mg/dL   GFR calc non Af Amer >90  >90 mL/min   GFR calc Af Amer >90  >90 mL/min   Comment: (NOTE)     The eGFR has been calculated using the CKD EPI equation.     This  calculation has not been validated in all clinical situations.     eGFR's persistently <90 mL/min signify possible Chronic Kidney     Disease.  LIPASE, BLOOD     Status: None   Collection Time    12/27/13  1:54 AM      Result Value Ref Range   Lipase 24  11 - 59 U/L    Ct Abdomen Pelvis W Contrast  12/27/2013   CLINICAL DATA:  Right upper quadrant pain, elevated liver function tests.  EXAM: CT ABDOMEN AND PELVIS WITH CONTRAST  TECHNIQUE: Multidetector CT imaging of the abdomen and pelvis was performed using the standard protocol following bolus administration of intravenous contrast.  CONTRAST:  65m OMNIPAQUE IOHEXOL 300 MG/ML SOLN, 108mOMNIPAQUE IOHEXOL 300 MG/ML SOLN  COMPARISON:  None available for comparison at time of study interpretation.  FINDINGS: Included view of the lung bases are clear. Visualized heart and pericardium are unremarkable.  The liver, spleen, pancreas and adrenal glands are unremarkable.  Gastric antral wall thickening and possible edema, with adjacent gallbladder which shows trace pericholecystic fluid (coronal 31/92). Subcentimeter linear calcifications in the gallbladder may be within the wall or reflect gallstones.  The stomach, small and large bowel are normal in course and caliber without inflammatory changes. Normal appendix. No intraperitoneal free fluid nor free air.  Kidneys are orthotopic, demonstrating symmetric enhancement without nephrolithiasis, hydronephrosis or renal masses. The unopacified ureters are normal in course and caliber. Delayed imaging through the kidneys demonstrates symmetric prompt excretion to the proximal urinary collecting system. Urinary bladder is partially distended and unremarkable.  Great vessels are normal in course and caliber. No lymphadenopathy by CT size criteria. Thickened appearance of the endometrium may reflect secretory phase. The soft tissues and included osseous structures are nonsuspicious.  IMPRESSION: Gastric antral wall  thickening could reflect gastritis, however there is associated inflammatory changes of the subjacent gallbladder with trace pericholecystic fluid. If clinical  concern for acute cholecystitis, consider right upper quadrant ultrasound.   Electronically Signed   By: Elon Alas   On: 12/27/2013 05:51   US Abdomen Limited Ruq  12/27/2013   CLINICAL DATA:  Abnormal gallbladder on CT.  EXAM: US ABDOMEN LIMITED - RIGHT UPPER QUADRANT  COMPARISON:  CT ABD - PELV W/ CM dated 12/27/2013  FINDINGS: Gallbladder:  Multiple mobile echogenic shadowing foci fill much of the gallbladder, likely stones. Gallbladder wall is thickened at 4 mm. Negative sonographic Murphy's. No pericholecystic fluid.  Common bile duct:  Diameter: Only a small portion of the common bile duct can be visualized and is normal caliber at 3 mm.  Liver:  No focal lesion identified. Within normal limits in parenchymal echogenicity.  IMPRESSION: Multiple shadowing gallstones fill much of the gallbladder lumen. Associated gallbladder wall thickening without sonographic Murphy sign may reflect chronic cholecystitis changes.   Electronically Signed   By: Rolm Baptise M.D.   On: 12/27/2013 10:27    ROS:  See chart  Blood pressure 127/58, pulse 60, temperature 97.9 F (36.6 C), temperature source Oral, resp. rate 16, height 6' (1.829 m), weight 76.2 kg (167 lb 15.9 oz), last menstrual period 12/13/2013, SpO2 100.00%. Physical Exam: Fatigue black female in no acute distress. Abdomen was soft with minimal tenderness in the right upper quadrant to deep palpation. No hepatosplenomegaly, masses, hernias, or rigidity are noted.  Assessment/Plan: Impression: Abdominal pain and nausea secondary to cholelithiasis. She does have a significant elevation in her transaminases. Have ordered a direct bilirubin to assess for choledocholithiasis. Plan: Would continue current management at this point. May have clear liquid diet. Would like the inflammation of the  liver to be decreased prior to laparoscopic cholecystectomy. This was explained to the patient and family, who understand and agreed to the treatment plan.  Adnan Vanvoorhis A 12/27/2013, 11:39 AM

## 2013-12-28 ENCOUNTER — Inpatient Hospital Stay (HOSPITAL_COMMUNITY): Payer: Medicaid Other

## 2013-12-28 ENCOUNTER — Encounter (HOSPITAL_COMMUNITY): Payer: Self-pay | Admitting: Gastroenterology

## 2013-12-28 DIAGNOSIS — R109 Unspecified abdominal pain: Secondary | ICD-10-CM

## 2013-12-28 DIAGNOSIS — R748 Abnormal levels of other serum enzymes: Secondary | ICD-10-CM

## 2013-12-28 LAB — COMPREHENSIVE METABOLIC PANEL
ALBUMIN: 3.7 g/dL (ref 3.5–5.2)
ALT: 309 U/L — ABNORMAL HIGH (ref 0–35)
AST: 171 U/L — ABNORMAL HIGH (ref 0–37)
Alkaline Phosphatase: 263 U/L — ABNORMAL HIGH (ref 39–117)
BUN: 5 mg/dL — ABNORMAL LOW (ref 6–23)
CO2: 25 mEq/L (ref 19–32)
CREATININE: 0.7 mg/dL (ref 0.50–1.10)
Calcium: 9.5 mg/dL (ref 8.4–10.5)
Chloride: 105 mEq/L (ref 96–112)
GFR calc Af Amer: >90 mL/min (ref >90)
GFR calc non Af Amer: >90 mL/min (ref >90)
Glucose, Bld: 89 mg/dL (ref 70–99)
Potassium: 4.3 mEq/L (ref 3.7–5.3)
Sodium: 140 mEq/L (ref 137–147)
TOTAL PROTEIN: 7.3 g/dL (ref 6.0–8.3)
Total Bilirubin: 1.8 mg/dL — ABNORMAL HIGH (ref 0.3–1.2)

## 2013-12-28 LAB — CBC
HEMATOCRIT: 32.7 % — AB (ref 36.0–46.0)
Hemoglobin: 10.9 g/dL — ABNORMAL LOW (ref 12.0–15.0)
MCH: 26.7 pg (ref 26.0–34.0)
MCHC: 33.3 g/dL (ref 30.0–36.0)
MCV: 80 fL (ref 78.0–100.0)
Platelets: 267 10*3/uL (ref 150–400)
RBC: 4.09 MIL/uL (ref 3.87–5.11)
RDW: 14.2 % (ref 11.5–15.5)
WBC: 4.8 10*3/uL (ref 4.0–10.5)

## 2013-12-28 MED ORDER — GADOBENATE DIMEGLUMINE 529 MG/ML IV SOLN: 15.0000 mL | Freq: Once | INTRAVENOUS | Status: AC | PRN

## 2013-12-28 MED ORDER — PANTOPRAZOLE SODIUM 40 MG PO TBEC
40.0000 mg | DELAYED_RELEASE_TABLET | Freq: Every day | ORAL | Status: DC
  Administered 2013-12-29 – 2013-12-31 (×2): 40 mg via ORAL
  Filled 2013-12-28 (×2): qty 1

## 2013-12-28 NOTE — Care Management Note (Unsigned)
    Page 1 of 1   12/28/2013     3:13:59 PM   CARE MANAGEMENT NOTE 12/28/2013  Patient:  Ronie SpiesMCCOLLUM,Avonell L   Account Number:  [id number]  Date Initiated:  12/28/2013  Documentation initiated by:  Rosemary HolmsOBSON,Makynlee Kressin  Subjective/Objective Assessment:   Pt admitted for cholecystitis, cholelithaisis. Scheduled for Lap Coli 4/1. [facility] Dr. Latricia HeftSusan Burks-Bermudez. [phone]     Action/Plan:   Anticipated DC Date:     Anticipated DC Plan:  HOME/SELF CARE      DC Planning Services  CM consult      Choice offered to / List presented to:             Status of service:   Medicare Important Message given?   (If response is "NO", the following Medicare IM given date fields will be blank) Date Medicare IM given:   Date Additional Medicare IM given:    Discharge Disposition:    Per UR Regulation:    If discussed at Long Length of Stay Meetings, dates discussed:    Comments:  12/28/13 Rosemary HolmsAmy Percell Lamboy RN BSN CM [facility] Requested H&P documentation

## 2013-12-28 NOTE — Consult Note (Signed)
Referring Provider: Dr. Lovell Sheehan Primary Care Physician:  No PCP Per Patient Primary Gastroenterologist:  Dr. Jena Gauss   Date of Admission: 12/27/13 Date of Consultation: 12/28/13  Reason for Consultation:  Choledocholithiasis  HPI:  Jacqueline [name] is a pleasant 33 year old female who presented with intermittent upper abdominal pain since the birth of her child 5 months ago. Elevated transaminases on admission with CT scan revealing gallstones with possible pericholecystic fluid. [place] of abdomen with multiple shadowing gallstones. Tbili elevated at 1.9 on admission with direct 0.7. MRCP then completed showing possible filling defect within most distal common bile duct at level of the ambulla. No significant intrahepatic biliary duct dilation. CBD 6mm. GI consulted by Dr. Lovell Sheehan for consideration of ERCP prior to elective cholecystectomy.   Pain onset about 5 months ago, located RUQ. Usually worse in the evenings. Intermittent. Associated N/V. Worsening in severity. Associated with eating. No hematemesis. No fever or chills. Noted blood in urine on Saturday and Sunday. No urinary burning. Feels improved since admission, pain controlled with narcotics. Breastfeeding. Daughter now receiving formula, but patient is pumping to keep up with milk supply so she may resume when procedures are completed.    Past Medical History  Diagnosis Date  . Herpes simplex without mention of complication     Past Surgical History  Procedure Laterality Date  . None      Prior to Admission medications   Medication Sig Start Date End Date Taking? Authorizing Provider  acyclovir (ZOVIRAX) 400 MG tablet Take 400 mg by mouth 2 (two) times daily.   Yes Historical Provider, MD  cholecalciferol (VITAMIN D) 1000 UNITS tablet Take 1,000 Units by mouth daily.   Yes Historical Provider, MD  escitalopram (LEXAPRO) 20 MG tablet Take 20 mg by mouth daily.   Yes Historical Provider, MD  ferrous sulfate 325 (65 FE) MG  tablet Take 325 mg by mouth daily with breakfast.   Yes Historical Provider, MD  FLUoxetine (PROZAC) 20 MG capsule Take 20 mg by mouth daily.   Yes Historical Provider, MD    Current Facility-Administered Medications  Medication Dose Route Frequency Provider Last Rate Last Dose  . 0.9 %  sodium chloride infusion   Intravenous Continuous Zannie Cove, MD 75 mL/hr at 12/27/13 0756    . acetaminophen (TYLENOL) tablet 650 mg  650 mg Oral Q6H PRN Zannie Cove, MD   650 mg at 12/27/13 1834  . ciprofloxacin (CIPRO) IVPB 400 mg  400 mg Intravenous Q12H Zannie Cove, MD   400 mg at 12/28/13 1108  . escitalopram (LEXAPRO) tablet 20 mg  20 mg Oral Daily Zannie Cove, MD   20 mg at 12/28/13 1108  . FLUoxetine (PROZAC) capsule 20 mg  20 mg Oral Daily Zannie Cove, MD   20 mg at 12/28/13 1108  . gadobenate dimeglumine (MULTIHANCE) injection 15 mL  15 mL Intravenous Once PRN Medication Radiologist, MD      . HYDROmorphone (DILAUDID) injection 1 mg  1 mg Intravenous Q3H PRN Zannie Cove, MD   1 mg at 12/28/13 1107  . metroNIDAZOLE (FLAGYL) IVPB 500 mg  500 mg Intravenous Q8H Zannie Cove, MD   500 mg at 12/28/13 1628  . morphine 4 MG/ML injection 4 mg  4 mg Intravenous Q2H PRN Lyanne Co, MD      . ondansetron [facility]) injection 4 mg  4 mg Intravenous Q6H PRN Lyanne Co, MD   4 mg at 12/27/13 2242  . ondansetron (ZOFRAN) injection 4 mg  4  mg Intravenous Q6H PRN Zannie Cove, MD   4 mg at 12/28/13 1107  . pantoprazole (PROTONIX) injection 40 mg  40 mg Intravenous Q24H Zannie Cove, MD   40 mg at 12/28/13 1107    Allergies as of 12/27/2013 - Review Complete 12/27/2013  Allergen Reaction Noted  . Penicillins Shortness Of Breath and Rash 05/27/2011    Family History  Problem Relation Age of Onset  . Colon cancer Neg Hx     History   Social History  . Marital Status: Single    Spouse Name: N/A    Number of Children: N/A  . Years of Education: N/A   Occupational History  .  Not on file.   Social History Main Topics  . Smoking status: Former Games developer  . Smokeless tobacco: Not on file  . Alcohol Use: No  . Drug Use: No  . Sexual Activity: Yes    Birth Control/ Protection: None   Other Topics Concern  . Not on file   Social History Narrative  . No narrative on file    Review of Systems: As mentioned in HPI.   Physical Exam: Vital signs in last 24 hours: Temp:  [98.3 F (36.8 C)-98.6 F (37 C)] 98.6 F (37 C) (03/30 0541) Pulse Rate:  [58-63] 58 (03/30 1636) Resp:  [20] 20 (03/30 1636) BP: (124-135)/(58-75) 135/58 mmHg (03/30 1636) SpO2:  [99 %-100 %] 100 % (03/30 1636) Last BM Date: 12/26/13 General:   Alert,  Well-developed, well-nourished, pleasant and cooperative in NAD Head:  Normocephalic and atraumatic. Eyes:  Sclera clear, no icterus.   Conjunctiva pink. Ears:  Normal auditory acuity. Nose:  No deformity, discharge,  or lesions. Mouth:  No deformity or lesions, dentition normal. Neck:  Supple; no masses or thyromegaly. Lungs:  Clear throughout to auscultation.   No wheezes, crackles, or rhonchi. No acute distress. Heart:  Regular rate and rhythm; no murmurs, clicks, rubs,  or gallops. Abdomen:  Soft, mild TTP RUQ and nondistended. No masses, hepatosplenomegaly or hernias noted. Normal bowel sounds, without guarding, and without rebound.   Rectal:  Deferred  Msk:  Symmetrical without gross deformities. Normal posture. Extremities:  Without clubbing or edema. Neurologic:  Alert and  oriented x4;  grossly normal neurologically. Skin:  Intact without significant lesions or rashes. Cervical Nodes:  No significant cervical adenopathy. Psych:  Alert and cooperative. Normal mood and affect.  Intake/Output from previous day: 03/29 0701 - 03/30 0700 In: 1665 [P.O.:240; I.V.:825; IV Piggyback:600] Out: 500 [Urine:500] Intake/Output this shift:    Lab Results:  Recent Labs  12/27/13 0154 12/28/13 0640  WBC 6.3 4.8  HGB 11.9* 10.9*   HCT 35.5* 32.7*  PLT 315 267   BMET  Recent Labs  12/27/13 0154 12/28/13 0640  NA 140 140  K 3.3* 4.3  CL 100 105  CO2 28 25  GLUCOSE 90 89  BUN 12 5*  CREATININE 0.66 0.70  CALCIUM 9.9 9.5   LFT  Recent Labs  12/27/13 0154 12/27/13 1239 12/28/13 0640  PROT 8.4*  --  7.3  ALBUMIN 4.4  --  3.7  AST 598*  --  171*  ALT 466*  --  309*  ALKPHOS 234*  --  263*  BILITOT 1.9*  --  1.8*  BILIDIR  --  1.7*  --     Studies/Results: Ct Abdomen Pelvis W Contrast  12/27/2013   CLINICAL DATA:  Right upper quadrant pain, elevated liver function tests.  EXAM: CT ABDOMEN AND PELVIS WITH  CONTRAST  TECHNIQUE: Multidetector CT imaging of the abdomen and pelvis was performed using the standard protocol following bolus administration of intravenous contrast.  CONTRAST:  50mL OMNIPAQUE IOHEXOL 300 MG/ML SOLN, OMNIPAQUE IOHEXOL 300 MG/ML SOLN  COMPARISON:  None available for comparison at time of study interpretation.  FINDINGS: Included view of the lung bases are clear. Visualized heart and pericardium are unremarkable.  The liver, spleen, pancreas and adrenal glands are unremarkable.  Gastric antral wall thickening and possible edema, with adjacent gallbladder which shows trace pericholecystic fluid (coronal 31/92). Subcentimeter linear calcifications in the gallbladder may be within the wall or reflect gallstones.  The stomach, small and large bowel are normal in course and caliber without inflammatory changes. Normal appendix. No intraperitoneal free fluid nor free air.  Kidneys are orthotopic, demonstrating symmetric enhancement without nephrolithiasis, hydronephrosis or renal masses. The unopacified ureters are normal in course and caliber. Delayed imaging through the kidneys demonstrates symmetric prompt excretion to the proximal urinary collecting system. Urinary bladder is partially distended and unremarkable.  Great vessels are normal in course and caliber. No lymphadenopathy by CT size  criteria. Thickened appearance of the endometrium may reflect secretory phase. The soft tissues and included osseous structures are nonsuspicious.  IMPRESSION: Gastric antral wall thickening could reflect gastritis, however there is associated inflammatory changes of the subjacent gallbladder with trace pericholecystic fluid. If clinical concern for acute cholecystitis, consider right upper quadrant ultrasound.   Electronically Signed   By: Awilda Metro   On: 12/27/2013 05:51   Mr Abdomen Mrcp Wo Cm  12/28/2013   CLINICAL DATA:  Gallstones. Abdominal pain and nausea. Breasfeeding patient. Gadolinium was avoided.  EXAM: MRI ABDOMEN WITHOUT  (INCLUDING MRCP)  TECHNIQUE: Multiplanar multisequence MR imaging of the abdomen was performed. Heavily T2-weighted images of the biliary and pancreatic ducts were obtained, and three-dimensional MRCP images were rendered by post processing.  COMPARISON:  No comparisons  FINDINGS: MRCP sequences demonstrate multiple small gallstones within the fundus of the gallbladder. Stent measure approximately 3-5 mm each and greater than 10 in number. There is a potential filling defect within the most distal common bile duct at the level of the ampulla measuring 5 mm which may represent a distal common bile duct stone (image 73, series 14). There is no significant intrahepatic biliary duct dilatation. The common bile duct is upper limits of normal at 6 mm. The pancreatic duct is normal. No evidence gallbladder inflammation. The gallbladder is upper limits of normal at 4 cm in diameter.  There small bilateral pleural effusions.  No focal hepatic lesion.  There is no pancreatic abnormality on this noncontrast exam.  The spleen, adrenal glands, kidneys appear normal.  IMPRESSION: 1. Multiple gallstones within the lumen of the gallbladder. 2. A potential stone within the most distal common bile duct. 3. Common bile duct is upper limits of normal in diameter. No intrahepatic biliary duct  dilatation. 4. No evidence of pancreatic inflammation.   Electronically Signed   By: Genevive Bi M.D.   On: 12/28/2013 11:59   Mr 3d Recon At Scanner  12/28/2013   CLINICAL DATA:  Gallstones. Abdominal pain and nausea. Breasfeeding patient. Gadolinium was avoided.  EXAM: MRI ABDOMEN WITHOUT  (INCLUDING MRCP)  TECHNIQUE: Multiplanar multisequence MR imaging of the abdomen was performed. Heavily T2-weighted images of the biliary and pancreatic ducts were obtained, and three-dimensional MRCP images were rendered by post processing.  COMPARISON:  No comparisons  FINDINGS: MRCP sequences demonstrate multiple small gallstones within the fundus of  the gallbladder. Stent measure approximately 3-5 mm each and greater than 10 in number. There is a potential filling defect within the most distal common bile duct at the level of the ampulla measuring 5 mm which may represent a distal common bile duct stone (image 73, series 14). There is no significant intrahepatic biliary duct dilatation. The common bile duct is upper limits of normal at 6 mm. The pancreatic duct is normal. No evidence gallbladder inflammation. The gallbladder is upper limits of normal at 4 cm in diameter.  There small bilateral pleural effusions.  No focal hepatic lesion.  There is no pancreatic abnormality on this noncontrast exam.  The spleen, adrenal glands, kidneys appear normal.  IMPRESSION: 1. Multiple gallstones within the lumen of the gallbladder. 2. A potential stone within the most distal common bile duct. 3. Common bile duct is upper limits of normal in diameter. No intrahepatic biliary duct dilatation. 4. No evidence of pancreatic inflammation.   Electronically Signed   By: Genevive BiStewart  Edmunds M.D.   On: 12/28/2013 11:59   [place] Abdomen Limited Ruq  12/27/2013   CLINICAL DATA:  Abnormal gallbladder on CT.  EXAM: US ABDOMEN LIMITED - RIGHT UPPER QUADRANT  COMPARISON:  CT ABD - PELV W/ CM dated 12/27/2013  FINDINGS: Gallbladder:  Multiple mobile  echogenic shadowing foci fill much of the gallbladder, likely stones. Gallbladder wall is thickened at 4 mm. Negative sonographic Murphy's. No pericholecystic fluid.  Common bile duct:  Diameter: Only a small portion of the common bile duct can be visualized and is normal caliber at 3 mm.  Liver:  No focal lesion identified. Within normal limits in parenchymal echogenicity.  IMPRESSION: Multiple shadowing gallstones fill much of the gallbladder lumen. Associated gallbladder wall thickening without sonographic Murphy sign may reflect chronic cholecystitis changes.   Electronically Signed   By: Charlett NoseKevin  Dover M.D.   On: 12/27/2013 10:27    Impression: 33 year old female admitted with several month history of RUQ pain and found to have cholelithiasis with question of chronic cholecystitis; due to mildly elevated Tbili, direct bilirubin and MRCP ordered with a potential stone noted in distal common bile duct. Gastric wall thickening also noted on CT. Transaminases overall improving from admission. Cholecystectomy tentatively planned for 4/1. Will review with Dr. Jena Gaussourk; discussed with patient likely need for ERCP with sphincterotomy and stone extraction. Risks and benefits discussed with patient and family at bedside. Patient currently on Cipro and Flagyl empirically.   Plan: Continue clear liquid diet, NPO after midnight Continue Protonix for GI prophylaxis, change to oral Supportive measures Review MRCP with Dr. Jena Gaussourk; likely will need ERCP with sphincterotomy and stone extraction on 3/31.   Nira RetortAnna W. Sams, ANP-BC [facility]      LOS: 1 day    12/28/2013, 4:49 PM  Attending note:  Patient seen and examined MRI films reviewed. MRI is suspicious for fluid meniscus/distal common bile duct stone. I discussed the approach of ERCP,  the potential risks, including a 1 in 10 chance of pancreatitis, benefits, limitations and imponderables.. Would like to reassess tomorrow morning, repeat  LFTs and discuss MRI further with the radiologist.

## 2013-12-28 NOTE — Progress Notes (Signed)
Due to elevated direct bilirubin, will get MRCP to further evaluate the common bile duct. Will transfer patient to my service. Further management pending MRCP results.

## 2013-12-29 ENCOUNTER — Encounter (HOSPITAL_COMMUNITY): Payer: Medicaid Other | Admitting: Anesthesiology

## 2013-12-29 ENCOUNTER — Inpatient Hospital Stay (HOSPITAL_COMMUNITY): Payer: Medicaid Other | Admitting: Anesthesiology

## 2013-12-29 ENCOUNTER — Encounter (HOSPITAL_COMMUNITY)
Admission: EM | Disposition: A | Discharge status: Home / Self Care | Payer: Self-pay | Source: Home / Self Care | Attending: General Surgery

## 2013-12-29 ENCOUNTER — Inpatient Hospital Stay (HOSPITAL_COMMUNITY): Payer: Medicaid Other

## 2013-12-29 ENCOUNTER — Encounter (HOSPITAL_COMMUNITY): Payer: Self-pay | Admitting: *Deleted

## 2013-12-29 DIAGNOSIS — K8051 Calculus of bile duct without cholangitis or cholecystitis with obstruction: Secondary | ICD-10-CM

## 2013-12-29 HISTORY — PX: SPHINCTEROTOMY: SHX5544

## 2013-12-29 HISTORY — PX: ERCP: SHX5425

## 2013-12-29 LAB — HEPATIC FUNCTION PANEL
ALBUMIN: 3.7 g/dL (ref 3.5–5.2)
ALK PHOS: 251 U/L — AB (ref 39–117)
ALT: 221 U/L — AB (ref 0–35)
AST: 72 U/L — ABNORMAL HIGH (ref 0–37)
Bilirubin, Direct: 0.2 mg/dL (ref 0.0–0.3)
Indirect Bilirubin: 0.5 mg/dL (ref 0.3–0.9)
TOTAL PROTEIN: 7.4 g/dL (ref 6.0–8.3)
Total Bilirubin: 0.7 mg/dL (ref 0.3–1.2)

## 2013-12-29 SURGERY — ERCP, WITH INTERVENTION IF INDICATED
Anesthesia: General

## 2013-12-29 MED ORDER — FENTANYL CITRATE 0.05 MG/ML IJ SOLN
INTRAMUSCULAR | Status: DC | PRN
  Administered 2013-12-29 (×4): 50 ug via INTRAVENOUS

## 2013-12-29 MED ORDER — SUCCINYLCHOLINE CHLORIDE 20 MG/ML IJ SOLN
INTRAMUSCULAR | Status: DC | PRN
  Administered 2013-12-29: 140 mg via INTRAVENOUS

## 2013-12-29 MED ORDER — MIDAZOLAM HCL 5 MG/5ML IJ SOLN
INTRAMUSCULAR | Status: DC | PRN
  Administered 2013-12-29: 2 mg via INTRAVENOUS

## 2013-12-29 MED ORDER — ONDANSETRON HCL 4 MG/2ML IJ SOLN
INTRAMUSCULAR | Status: AC
  Filled 2013-12-29: qty 2

## 2013-12-29 MED ORDER — LIDOCAINE HCL (PF) 1 % IJ SOLN
INTRAMUSCULAR | Status: AC
  Filled 2013-12-29: qty 5

## 2013-12-29 MED ORDER — SUCCINYLCHOLINE CHLORIDE 20 MG/ML IJ SOLN
INTRAMUSCULAR | Status: AC
Start: 2013-12-29 — End: 2013-12-29
  Filled 2013-12-29: qty 1

## 2013-12-29 MED ORDER — MIDAZOLAM HCL 5 MG/5ML IJ SOLN
INTRAMUSCULAR | Status: AC
  Filled 2013-12-29: qty 5

## 2013-12-29 MED ORDER — ONDANSETRON HCL 4 MG/2ML IJ SOLN
4.0000 mg | Freq: Once | INTRAMUSCULAR | Status: AC | PRN
  Administered 2013-12-29: 4 mg via INTRAVENOUS

## 2013-12-29 MED ORDER — SODIUM CHLORIDE 0.9 % IJ SOLN
INTRAMUSCULAR | Status: AC
  Filled 2013-12-29: qty 10

## 2013-12-29 MED ORDER — GLYCOPYRROLATE 0.2 MG/ML IJ SOLN
INTRAMUSCULAR | Status: AC
  Filled 2013-12-29: qty 1

## 2013-12-29 MED ORDER — SODIUM CHLORIDE 0.9 % IV SOLN
INTRAVENOUS | Status: AC
  Filled 2013-12-29: qty 100

## 2013-12-29 MED ORDER — MIDAZOLAM HCL 2 MG/2ML IJ SOLN
INTRAMUSCULAR | Status: AC
  Filled 2013-12-29: qty 2

## 2013-12-29 MED ORDER — SODIUM CHLORIDE 0.9 % IV SOLN
INTRAVENOUS | Status: DC | PRN
  Administered 2013-12-29: 12:00:00

## 2013-12-29 MED ORDER — NEOSTIGMINE METHYLSULFATE 1 MG/ML IJ SOLN
INTRAMUSCULAR | Status: DC | PRN
  Administered 2013-12-29: 3 mg via INTRAVENOUS

## 2013-12-29 MED ORDER — PROPOFOL 10 MG/ML IV BOLUS
INTRAVENOUS | Status: AC
  Filled 2013-12-29: qty 20

## 2013-12-29 MED ORDER — LACTATED RINGERS IV SOLN
INTRAVENOUS | Status: DC
  Administered 2013-12-29: 11:00:00 via INTRAVENOUS

## 2013-12-29 MED ORDER — STERILE WATER FOR IRRIGATION IR SOLN
Status: DC | PRN
  Administered 2013-12-29: 12:00:00

## 2013-12-29 MED ORDER — LIDOCAINE HCL 1 % IJ SOLN
INTRAMUSCULAR | Status: DC | PRN
  Administered 2013-12-29: 50 mg via INTRADERMAL

## 2013-12-29 MED ORDER — GLYCOPYRROLATE 0.2 MG/ML IJ SOLN
INTRAMUSCULAR | Status: DC | PRN
  Administered 2013-12-29 (×2): 0.2 mg via INTRAVENOUS

## 2013-12-29 MED ORDER — FENTANYL CITRATE 0.05 MG/ML IJ SOLN
INTRAMUSCULAR | Status: AC
  Filled 2013-12-29: qty 2

## 2013-12-29 MED ORDER — GLYCOPYRROLATE 0.2 MG/ML IJ SOLN
INTRAMUSCULAR | Status: AC
  Filled 2013-12-29: qty 2

## 2013-12-29 MED ORDER — MIDAZOLAM HCL 2 MG/2ML IJ SOLN
1.0000 mg | Freq: Once | INTRAMUSCULAR | Status: AC
Start: 2013-12-29 — End: 2013-12-29
  Administered 2013-12-29: 1 mg via INTRAVENOUS

## 2013-12-29 MED ORDER — GLUCAGON HCL (RDNA) 1 MG IJ SOLR
INTRAMUSCULAR | Status: AC
  Filled 2013-12-29: qty 2

## 2013-12-29 MED ORDER — SODIUM CHLORIDE 0.9 % IV SOLN
INTRAVENOUS | Status: DC
Start: 2013-12-29 — End: 2013-12-30

## 2013-12-29 MED ORDER — MIDAZOLAM HCL 2 MG/2ML IJ SOLN
1.0000 mg | INTRAMUSCULAR | Status: DC | PRN
  Administered 2013-12-29: 2 mg via INTRAVENOUS
  Filled 2013-12-29: qty 2

## 2013-12-29 MED ORDER — GLYCOPYRROLATE 0.2 MG/ML IJ SOLN
0.2000 mg | Freq: Once | INTRAMUSCULAR | Status: AC
  Administered 2013-12-29: 0.2 mg via INTRAVENOUS

## 2013-12-29 MED ORDER — CHLORHEXIDINE GLUCONATE 4 % EX LIQD
1.0000 "application " | Freq: Once | CUTANEOUS | Status: AC
  Administered 2013-12-29: 1 via TOPICAL
  Filled 2013-12-29: qty 15

## 2013-12-29 MED ORDER — FENTANYL CITRATE 0.05 MG/ML IJ SOLN
25.0000 ug | INTRAMUSCULAR | Status: DC | PRN
  Administered 2013-12-29: 50 ug via INTRAVENOUS

## 2013-12-29 MED ORDER — ROCURONIUM BROMIDE 100 MG/10ML IV SOLN
INTRAVENOUS | Status: DC | PRN
  Administered 2013-12-29: 15 mg via INTRAVENOUS

## 2013-12-29 MED ORDER — PROPOFOL 10 MG/ML IV BOLUS
INTRAVENOUS | Status: DC | PRN
  Administered 2013-12-29: 150 mg via INTRAVENOUS

## 2013-12-29 MED ORDER — ONDANSETRON HCL 4 MG/2ML IJ SOLN
4.0000 mg | Freq: Once | INTRAMUSCULAR | Status: AC
  Administered 2013-12-29: 4 mg via INTRAVENOUS

## 2013-12-29 SURGICAL SUPPLY — 27 items
BALLN RETRIEVAL 12X15 (BALLOONS) ×2 IMPLANT
BALLN RETRIEVAL 12X15MM (BALLOONS) ×1
BASKET TRAPEZOID 3X6 (MISCELLANEOUS) IMPLANT
BASKET TRAPEZOID LITHO 2.5X5 (MISCELLANEOUS) IMPLANT
BLOCK BITE 60FR ADLT L/F BLUE (MISCELLANEOUS) ×3 IMPLANT
DEVICE INFLATION ENCORE 26 (MISCELLANEOUS) IMPLANT
DEVICE LOCKING W-BIOPSY CAP (MISCELLANEOUS) ×3 IMPLANT
FLOOR PAD 36X40 (MISCELLANEOUS) ×3
GUIDEWIRE HYDRA JAGWIRE .35 (WIRE) IMPLANT
GUIDEWIRE JAG HINI 025X260CM (WIRE) IMPLANT
KIT CLEAN ENDO COMPLIANCE (KITS) ×3 IMPLANT
NEEDLE HYPO 18GX1.5 BLUNT FILL (NEEDLE) ×3 IMPLANT
PAD FLOOR 36X40 (MISCELLANEOUS) ×1 IMPLANT
SNARE ROTATE MED OVAL 20MM (MISCELLANEOUS) IMPLANT
SNARE SHORT THROW 13M SML OVAL (MISCELLANEOUS) ×3 IMPLANT
SNARE SHORT THROW 27M MED OVAL (MISCELLANEOUS) IMPLANT
SPHINCTEROTOME AUTOTOME .25 (MISCELLANEOUS) ×3 IMPLANT
SPHINCTEROTOME HYDRATOME 44 (MISCELLANEOUS) ×3 IMPLANT
SPONGE GAUZE 4X4 12PLY (GAUZE/BANDAGES/DRESSINGS) ×3 IMPLANT
SYR 20CC LL (SYRINGE) IMPLANT
SYR 3ML LL SCALE MARK (SYRINGE) IMPLANT
SYR 50ML LL SCALE MARK (SYRINGE) ×6 IMPLANT
SYSTEM CONTINUOUS INJECTION (MISCELLANEOUS) ×3 IMPLANT
TUBING ENDO SMARTCAP PENTAX (MISCELLANEOUS) ×3 IMPLANT
WALLSTENT METAL COVERED 10X60 (STENTS) IMPLANT
WALLSTENT METAL COVERED 10X80 (STENTS) IMPLANT
WATER STERILE IRR 1000ML POUR (IV SOLUTION) ×6 IMPLANT

## 2013-12-29 NOTE — Transfer of Care (Signed)
Immediate Anesthesia Transfer of Care Note  Patient: Jacqueline SpiesShikea L [name]  Procedure(s) Performed: Procedure(s) with comments: ENDOSCOPIC RETROGRADE CHOLANGIOPANCREATOGRAPHY (ERCP) (N/A) - and stone extraction SPHINCTEROTOMY (N/A)  Patient Location: PACU  Anesthesia Type:General  Level of Consciousness: awake and patient cooperative  Airway & Oxygen Therapy: Patient Spontanous Breathing and Patient connected to face mask oxygen  Post-op Assessment: Report given to PACU RN, Post -op Vital signs reviewed and stable and Patient moving all extremities  Post vital signs: Reviewed and stable  Complications: No apparent anesthesia complications

## 2013-12-29 NOTE — Progress Notes (Signed)
Awake. Crying. Moaning/groaning. Restless. Yelling. Oriented to place per nurse. Reassurance given. C/O throat pain.

## 2013-12-29 NOTE — Progress Notes (Signed)
Awake. Restless. Crying. States "I am nervous." Reassurance given. Med as noted.

## 2013-12-29 NOTE — Op Note (Signed)
Jacqueline [name], Jacqueline [name]             ACCOUNT NO.:  [id number]  MEDICAL RECORD NO.:  [id number]  LOCATION:  [facility]                          FACILITY:  [facility]  PHYSICIAN:  R. Roetta Sessions, MD FACP FACGDATE OF BIRTH:  [birth year]  DATE OF PROCEDURE:  12/29/2013 DATE OF DISCHARGE:                              OPERATIVE REPORT   PROCEDURE:  Endoscopic retrograde cholangiopancreatogram with biliary sphincterotomy, balloon stone extraction.  INDICATIONS FOR PROCEDURE:  This is a pleasant 33 year old lady, admitted to hospital with biliary colic, has cholelithiasis and common duct stone in the distal aspect of a mildly dilated bile duct on MRCP. LFTs are elevated.  ERCP now being performed.  This approach has discussed with the patient yesterday and again this morning at the bedside.  Potential risks, benefits, limitations, alternatives, imponderables have been discussed, specifically talked about 1:10 chance of pancreatitis, perforation, reaction to medications, and remote need for a stent, etc.  Questions have been answered.  All parties agreeable. The patient was on IV Cipro and Flagyl.  PROCEDURE NOTE:  O2 saturation, blood pressure, pulse, respirations monitored throughout the entire procedure.  ANESTHESIA:  Endotracheal induced by Dr. Marcos Eke and associates.  The patient was placed in the semiprone position on the OR table.  INSTRUMENTATION:  Pentax video chip duodenoscope.  FINDINGS:  Cursory examination of the distal esophagus and stomach revealed no abnormalities.  Pylorus had been easily traversed.  The scope was advanced to on the second portion of the duodenum and was pulled back to the short position 55 cm from the incisors.  The ampulla of Vater was readily identified on the medial wall up to second portion of the duodenum.  Scout film was taken.  Subsequently, using the Microvasive sphincterotome, the ampulla was approached.  With guidewire palpation, I achieved almost  immediate deep biliary cannulation. Cholangiogram was performed.  There appeared to be a small filling defect in the very distal common bile duct.  The bile duct appeared to be mildly dilated at 5-6 mm.  Keeping the safety wire deep in the biliary tree, the sphincterotome was pulled across the ampullary orifice, and at the 12 o'clock position, using the Erbe unit, approximately 1 cm sphincterotomy was performed without difficulty or apparent complication.  The sphincterotome was railed off the wire, an extractor balloon was railed to the confluence and inflated to accommodate the size of the bile duct.  A balloon occlusion cholangiogram was performed.  With this maneuver, small pieces of gravel were recovered.  There was some impediment to flow of bile through the ampullary orifice, this maneuver was repeated with recovery of some more gravel and there was a cholesterol stone which appeared at the ampullary orifice.  With simply suctioning ith the duodenoscope, the stone fell into the duodenal lumen.  After this maneuver was performed, the bile duct was seen to deliver copious, nearly black colored bile through the orifice.  There was excellent drainage and fluoroscopically the biliary tree appeared to drain very well.  There were no persistent filling defects.  There were no strictures seen.  The pancreatic duct was not manipulated or injected.  The patient tolerated the procedure well. Fluoroscopy time 2 minutes.  IMPRESSION: 1. Normal  ampulla. 2. Choledocholithiasis, status post biliary sphincterotomy and balloon     stone extraction.  RECOMMENDATIONS: 1. Clear liquid diet. 2. Repeat LFTs tomorrow morning. 3. N.p.o. past midnight, tonight in anticipation of a laparoscopic     cholecystectomy tomorrow.     R. Michael Airen Stiehl, MD Jacqueline [name]    RMR/MEDQ  D:  12/29/2013  T:  12/29/2013  Job:  [id number]  cc:   Dalia HeadingMark A. Jenkins, M.D. Fax: [phone]

## 2013-12-29 NOTE — Progress Notes (Signed)
Reviewed MRCP images were Dr. Jena GaussMaxwell this morning. There is felt to be a stone likely ball valving in the distal common bile duct seen on this imaging study with a high level of confidence.  LFTs have improved but are not normal Given compelling MRCP findings including a dilated bile duct, we'll plan to proceed with an ERCP to clear the duct later today

## 2013-12-29 NOTE — Anesthesia Preprocedure Evaluation (Signed)
Anesthesia Evaluation  Patient identified by MRN, date of birth, ID band Patient awake    Reviewed: Allergy & Precautions, H&P , NPO status , Patient's Chart, lab work & pertinent test results  Airway Mallampati: II TM Distance: >3 FB Neck ROM: Full    Dental  (+) Teeth Intact   Pulmonary former smoker,  breath sounds clear to auscultation        Cardiovascular negative cardio ROS  Rhythm:Regular Rate:Normal     Neuro/Psych Depression    GI/Hepatic negative GI ROS,   Endo/Other    Renal/GU      Musculoskeletal   Abdominal   Peds  Hematology   Anesthesia Other Findings   Reproductive/Obstetrics                           Anesthesia Physical Anesthesia Plan  ASA: II  Anesthesia Plan: General   Post-op Pain Management:    Induction: Intravenous  Airway Management Planned: Oral ETT  Additional Equipment:   Intra-op Plan:   Post-operative Plan: Extubation in OR  Informed Consent: I have reviewed the patients History and Physical, chart, labs and discussed the procedure including the risks, benefits and alternatives for the proposed anesthesia with the patient or authorized representative who has indicated his/her understanding and acceptance.     Plan Discussed with:   Anesthesia Plan Comments:         Anesthesia Quick Evaluation  

## 2013-12-29 NOTE — Interval H&P Note (Signed)
History and Physical Interval Note:  12/29/2013 11:25 AM  Jacqueline [name]  has presented today for surgery, with the diagnosis of Choledocholithiasis  The various methods of treatment have been discussed with the patient and family. After consideration of risks, benefits and other options for treatment, the patient has consented to  Procedure(s) with comments: ENDOSCOPIC RETROGRADE CHOLANGIOPANCREATOGRAPHY (ERCP) (N/A) - with sphincterotomy and stone extraction as a surgical intervention .  The patient's history has been reviewed, patient examined, no change in status, stable for surgery.  I have reviewed the patient's chart and labs.  Questions were answered to the patient's satisfaction.     The risks, benefits, limitations, alternatives, and mponderable have been reviewed with the patient. I specifically discussed a1 in 10 chance of pancreatitis, reaction to medications, bleeding, perforation and the possibility of a failed ERCP. Potential for sphincterotomy and stent placement also reviewed. Questions have been answered. All parties agreeable.  Eula Listenobert Haile Toppins

## 2013-12-29 NOTE — Anesthesia Procedure Notes (Signed)
Procedure Name: Intubation Date/Time: 12/29/2013 11:42 AM Performed by: Despina HiddenIDACAVAGE, Swayze Kozuch J Pre-anesthesia Checklist: Patient being monitored, Suction available, Emergency Drugs available and Patient identified Patient Re-evaluated:Patient Re-evaluated prior to inductionOxygen Delivery Method: Circle system utilized Preoxygenation: Pre-oxygenation with 100% oxygen Intubation Type: IV induction Ventilation: Mask ventilation without difficulty and Oral airway inserted - appropriate to patient size Laryngoscope Size: Mac and 3 Grade View: Grade II Tube type: Oral Number of attempts: 1 Airway Equipment and Method: Stylet Placement Confirmation: ETT inserted through vocal cords under direct vision,  positive ETCO2 and breath sounds checked- equal and bilateral Secured at: 20 cm Tube secured with: Tape Dental Injury: Teeth and Oropharynx as per pre-operative assessment

## 2013-12-29 NOTE — Anesthesia Postprocedure Evaluation (Signed)
  Anesthesia Post-op Note  Patient: Jacqueline [name]  Procedure(s) Performed: Procedure(s) with comments: ENDOSCOPIC RETROGRADE CHOLANGIOPANCREATOGRAPHY (ERCP) (N/A) - and stone extraction SPHINCTEROTOMY (N/A)  Patient Location: PACU  Anesthesia Type:General  Level of Consciousness: awake, alert , oriented and patient cooperative  Airway and Oxygen Therapy: Patient Spontanous Breathing  Post-op Pain: 3 /10, mild  Post-op Assessment: Post-op Vital signs reviewed, Patient's Cardiovascular Status Stable, Respiratory Function Stable, Patent Airway, No signs of Nausea or vomiting and Pain level controlled  Post-op Vital Signs: Reviewed and stable  Complications: No apparent anesthesia complications

## 2013-12-29 NOTE — H&P (View-Only) (Signed)
Referring Provider: Dr. Lovell Sheehan Primary Care Physician:  No PCP Per Patient Primary Gastroenterologist:  Dr. Jena Gauss   Date of Admission: 12/27/13 Date of Consultation: 12/28/13  Reason for Consultation:  Choledocholithiasis  HPI:  Jacqueline [name] is a pleasant 33 year old female who presented with intermittent upper abdominal pain since the birth of her child 5 months ago. Elevated transaminases on admission with CT scan revealing gallstones with possible pericholecystic fluid. [place] of abdomen with multiple shadowing gallstones. Tbili elevated at 1.9 on admission with direct 0.7. MRCP then completed showing possible filling defect within most distal common bile duct at level of the ambulla. No significant intrahepatic biliary duct dilation. CBD 6mm. GI consulted by Dr. Lovell Sheehan for consideration of ERCP prior to elective cholecystectomy.   Pain onset about 5 months ago, located RUQ. Usually worse in the evenings. Intermittent. Associated N/V. Worsening in severity. Associated with eating. No hematemesis. No fever or chills. Noted blood in urine on Saturday and Sunday. No urinary burning. Feels improved since admission, pain controlled with narcotics. Breastfeeding. Daughter now receiving formula, but patient is pumping to keep up with milk supply so she may resume when procedures are completed.    Past Medical History  Diagnosis Date  . Herpes simplex without mention of complication     Past Surgical History  Procedure Laterality Date  . None      Prior to Admission medications   Medication Sig Start Date End Date Taking? Authorizing Provider  acyclovir (ZOVIRAX) 400 MG tablet Take 400 mg by mouth 2 (two) times daily.   Yes Historical Provider, MD  cholecalciferol (VITAMIN D) 1000 UNITS tablet Take 1,000 Units by mouth daily.   Yes Historical Provider, MD  escitalopram (LEXAPRO) 20 MG tablet Take 20 mg by mouth daily.   Yes Historical Provider, MD  ferrous sulfate 325 (65 FE) MG  tablet Take 325 mg by mouth daily with breakfast.   Yes Historical Provider, MD  FLUoxetine (PROZAC) 20 MG capsule Take 20 mg by mouth daily.   Yes Historical Provider, MD    Current Facility-Administered Medications  Medication Dose Route Frequency Provider Last Rate Last Dose  . 0.9 %  sodium chloride infusion   Intravenous Continuous Zannie Cove, MD 75 mL/hr at 12/27/13 0756    . acetaminophen (TYLENOL) tablet 650 mg  650 mg Oral Q6H PRN Zannie Cove, MD   650 mg at 12/27/13 1834  . ciprofloxacin (CIPRO) IVPB 400 mg  400 mg Intravenous Q12H Zannie Cove, MD   400 mg at 12/28/13 1108  . escitalopram (LEXAPRO) tablet 20 mg  20 mg Oral Daily Zannie Cove, MD   20 mg at 12/28/13 1108  . FLUoxetine (PROZAC) capsule 20 mg  20 mg Oral Daily Zannie Cove, MD   20 mg at 12/28/13 1108  . gadobenate dimeglumine (MULTIHANCE) injection 15 mL  15 mL Intravenous Once PRN Medication Radiologist, MD      . HYDROmorphone (DILAUDID) injection 1 mg  1 mg Intravenous Q3H PRN Zannie Cove, MD   1 mg at 12/28/13 1107  . metroNIDAZOLE (FLAGYL) IVPB 500 mg  500 mg Intravenous Q8H Zannie Cove, MD   500 mg at 12/28/13 1628  . morphine 4 MG/ML injection 4 mg  4 mg Intravenous Q2H PRN Lyanne Co, MD      . ondansetron [facility]) injection 4 mg  4 mg Intravenous Q6H PRN Lyanne Co, MD   4 mg at 12/27/13 2242  . ondansetron (ZOFRAN) injection 4 mg  4  mg Intravenous Q6H PRN Zannie Cove, MD   4 mg at 12/28/13 1107  . pantoprazole (PROTONIX) injection 40 mg  40 mg Intravenous Q24H Zannie Cove, MD   40 mg at 12/28/13 1107    Allergies as of 12/27/2013 - Review Complete 12/27/2013  Allergen Reaction Noted  . Penicillins Shortness Of Breath and Rash 05/27/2011    Family History  Problem Relation Age of Onset  . Colon cancer Neg Hx     History   Social History  . Marital Status: Single    Spouse Name: N/A    Number of Children: N/A  . Years of Education: N/A   Occupational History  .  Not on file.   Social History Main Topics  . Smoking status: Former Games developer  . Smokeless tobacco: Not on file  . Alcohol Use: No  . Drug Use: No  . Sexual Activity: Yes    Birth Control/ Protection: None   Other Topics Concern  . Not on file   Social History Narrative  . No narrative on file    Review of Systems: As mentioned in HPI.   Physical Exam: Vital signs in last 24 hours: Temp:  [98.3 F (36.8 C)-98.6 F (37 C)] 98.6 F (37 C) (03/30 0541) Pulse Rate:  [58-63] 58 (03/30 1636) Resp:  [20] 20 (03/30 1636) BP: (124-135)/(58-75) 135/58 mmHg (03/30 1636) SpO2:  [99 %-100 %] 100 % (03/30 1636) Last BM Date: 12/26/13 General:   Alert,  Well-developed, well-nourished, pleasant and cooperative in NAD Head:  Normocephalic and atraumatic. Eyes:  Sclera clear, no icterus.   Conjunctiva pink. Ears:  Normal auditory acuity. Nose:  No deformity, discharge,  or lesions. Mouth:  No deformity or lesions, dentition normal. Neck:  Supple; no masses or thyromegaly. Lungs:  Clear throughout to auscultation.   No wheezes, crackles, or rhonchi. No acute distress. Heart:  Regular rate and rhythm; no murmurs, clicks, rubs,  or gallops. Abdomen:  Soft, mild TTP RUQ and nondistended. No masses, hepatosplenomegaly or hernias noted. Normal bowel sounds, without guarding, and without rebound.   Rectal:  Deferred  Msk:  Symmetrical without gross deformities. Normal posture. Extremities:  Without clubbing or edema. Neurologic:  Alert and  oriented x4;  grossly normal neurologically. Skin:  Intact without significant lesions or rashes. Cervical Nodes:  No significant cervical adenopathy. Psych:  Alert and cooperative. Normal mood and affect.  Intake/Output from previous day: 03/29 0701 - 03/30 0700 In: 1665 [P.O.:240; I.V.:825; IV Piggyback:600] Out: 500 [Urine:500] Intake/Output this shift:    Lab Results:  Recent Labs  12/27/13 0154 12/28/13 0640  WBC 6.3 4.8  HGB 11.9* 10.9*   HCT 35.5* 32.7*  PLT 315 267   BMET  Recent Labs  12/27/13 0154 12/28/13 0640  NA 140 140  K 3.3* 4.3  CL 100 105  CO2 28 25  GLUCOSE 90 89  BUN 12 5*  CREATININE 0.66 0.70  CALCIUM 9.9 9.5   LFT  Recent Labs  12/27/13 0154 12/27/13 1239 12/28/13 0640  PROT 8.4*  --  7.3  ALBUMIN 4.4  --  3.7  AST 598*  --  171*  ALT 466*  --  309*  ALKPHOS 234*  --  263*  BILITOT 1.9*  --  1.8*  BILIDIR  --  1.7*  --     Studies/Results: Ct Abdomen Pelvis W Contrast  12/27/2013   CLINICAL DATA:  Right upper quadrant pain, elevated liver function tests.  EXAM: CT ABDOMEN AND PELVIS WITH  CONTRAST  TECHNIQUE: Multidetector CT imaging of the abdomen and pelvis was performed using the standard protocol following bolus administration of intravenous contrast.  CONTRAST:  50mL OMNIPAQUE IOHEXOL 300 MG/ML SOLN, OMNIPAQUE IOHEXOL 300 MG/ML SOLN  COMPARISON:  None available for comparison at time of study interpretation.  FINDINGS: Included view of the lung bases are clear. Visualized heart and pericardium are unremarkable.  The liver, spleen, pancreas and adrenal glands are unremarkable.  Gastric antral wall thickening and possible edema, with adjacent gallbladder which shows trace pericholecystic fluid (coronal 31/92). Subcentimeter linear calcifications in the gallbladder may be within the wall or reflect gallstones.  The stomach, small and large bowel are normal in course and caliber without inflammatory changes. Normal appendix. No intraperitoneal free fluid nor free air.  Kidneys are orthotopic, demonstrating symmetric enhancement without nephrolithiasis, hydronephrosis or renal masses. The unopacified ureters are normal in course and caliber. Delayed imaging through the kidneys demonstrates symmetric prompt excretion to the proximal urinary collecting system. Urinary bladder is partially distended and unremarkable.  Great vessels are normal in course and caliber. No lymphadenopathy by CT size  criteria. Thickened appearance of the endometrium may reflect secretory phase. The soft tissues and included osseous structures are nonsuspicious.  IMPRESSION: Gastric antral wall thickening could reflect gastritis, however there is associated inflammatory changes of the subjacent gallbladder with trace pericholecystic fluid. If clinical concern for acute cholecystitis, consider right upper quadrant ultrasound.   Electronically Signed   By: Awilda Metro   On: 12/27/2013 05:51   Mr Abdomen Mrcp Wo Cm  12/28/2013   CLINICAL DATA:  Gallstones. Abdominal pain and nausea. Breasfeeding patient. Gadolinium was avoided.  EXAM: MRI ABDOMEN WITHOUT  (INCLUDING MRCP)  TECHNIQUE: Multiplanar multisequence MR imaging of the abdomen was performed. Heavily T2-weighted images of the biliary and pancreatic ducts were obtained, and three-dimensional MRCP images were rendered by post processing.  COMPARISON:  No comparisons  FINDINGS: MRCP sequences demonstrate multiple small gallstones within the fundus of the gallbladder. Stent measure approximately 3-5 mm each and greater than 10 in number. There is a potential filling defect within the most distal common bile duct at the level of the ampulla measuring 5 mm which may represent a distal common bile duct stone (image 73, series 14). There is no significant intrahepatic biliary duct dilatation. The common bile duct is upper limits of normal at 6 mm. The pancreatic duct is normal. No evidence gallbladder inflammation. The gallbladder is upper limits of normal at 4 cm in diameter.  There small bilateral pleural effusions.  No focal hepatic lesion.  There is no pancreatic abnormality on this noncontrast exam.  The spleen, adrenal glands, kidneys appear normal.  IMPRESSION: 1. Multiple gallstones within the lumen of the gallbladder. 2. A potential stone within the most distal common bile duct. 3. Common bile duct is upper limits of normal in diameter. No intrahepatic biliary duct  dilatation. 4. No evidence of pancreatic inflammation.   Electronically Signed   By: Genevive Bi M.D.   On: 12/28/2013 11:59   Mr 3d Recon At Scanner  12/28/2013   CLINICAL DATA:  Gallstones. Abdominal pain and nausea. Breasfeeding patient. Gadolinium was avoided.  EXAM: MRI ABDOMEN WITHOUT  (INCLUDING MRCP)  TECHNIQUE: Multiplanar multisequence MR imaging of the abdomen was performed. Heavily T2-weighted images of the biliary and pancreatic ducts were obtained, and three-dimensional MRCP images were rendered by post processing.  COMPARISON:  No comparisons  FINDINGS: MRCP sequences demonstrate multiple small gallstones within the fundus of  the gallbladder. Stent measure approximately 3-5 mm each and greater than 10 in number. There is a potential filling defect within the most distal common bile duct at the level of the ampulla measuring 5 mm which may represent a distal common bile duct stone (image 73, series 14). There is no significant intrahepatic biliary duct dilatation. The common bile duct is upper limits of normal at 6 mm. The pancreatic duct is normal. No evidence gallbladder inflammation. The gallbladder is upper limits of normal at 4 cm in diameter.  There small bilateral pleural effusions.  No focal hepatic lesion.  There is no pancreatic abnormality on this noncontrast exam.  The spleen, adrenal glands, kidneys appear normal.  IMPRESSION: 1. Multiple gallstones within the lumen of the gallbladder. 2. A potential stone within the most distal common bile duct. 3. Common bile duct is upper limits of normal in diameter. No intrahepatic biliary duct dilatation. 4. No evidence of pancreatic inflammation.   Electronically Signed   By: Genevive BiStewart  Edmunds M.D.   On: 12/28/2013 11:59   [place] Abdomen Limited Ruq  12/27/2013   CLINICAL DATA:  Abnormal gallbladder on CT.  EXAM: US ABDOMEN LIMITED - RIGHT UPPER QUADRANT  COMPARISON:  CT ABD - PELV W/ CM dated 12/27/2013  FINDINGS: Gallbladder:  Multiple mobile  echogenic shadowing foci fill much of the gallbladder, likely stones. Gallbladder wall is thickened at 4 mm. Negative sonographic Murphy's. No pericholecystic fluid.  Common bile duct:  Diameter: Only a small portion of the common bile duct can be visualized and is normal caliber at 3 mm.  Liver:  No focal lesion identified. Within normal limits in parenchymal echogenicity.  IMPRESSION: Multiple shadowing gallstones fill much of the gallbladder lumen. Associated gallbladder wall thickening without sonographic Murphy sign may reflect chronic cholecystitis changes.   Electronically Signed   By: Charlett NoseKevin  Dover M.D.   On: 12/27/2013 10:27    Impression: 33 year old female admitted with several month history of RUQ pain and found to have cholelithiasis with question of chronic cholecystitis; due to mildly elevated Tbili, direct bilirubin and MRCP ordered with a potential stone noted in distal common bile duct. Gastric wall thickening also noted on CT. Transaminases overall improving from admission. Cholecystectomy tentatively planned for 4/1. Will review with Dr. Jena Gaussourk; discussed with patient likely need for ERCP with sphincterotomy and stone extraction. Risks and benefits discussed with patient and family at bedside. Patient currently on Cipro and Flagyl empirically.   Plan: Continue clear liquid diet, NPO after midnight Continue Protonix for GI prophylaxis, change to oral Supportive measures Review MRCP with Dr. Jena Gaussourk; likely will need ERCP with sphincterotomy and stone extraction on 3/31.   Nira RetortAnna W. Sams, ANP-BC [facility]      LOS: 1 day    12/28/2013, 4:49 PM  Attending note:  Patient seen and examined MRI films reviewed. MRI is suspicious for fluid meniscus/distal common bile duct stone. I discussed the approach of ERCP,  the potential risks, including a 1 in 10 chance of pancreatitis, benefits, limitations and imponderables.. Would like to reassess tomorrow morning, repeat  LFTs and discuss MRI further with the radiologist.

## 2013-12-29 NOTE — Progress Notes (Signed)
Patient seen earlier today. She underwent a successful ERCP with stone extraction by Dr. Jena Gaussourk. Orders have been written to proceed with a laparoscopic cholecystectomy tomorrow. The risks and benefits of the procedure including bleeding, infection, hepatobiliary injury, and the possibility of an open procedure were fully explained to the patient, who gave informed consent.

## 2013-12-29 NOTE — Progress Notes (Signed)
Resting quietly. resp adequate/nonlabored. More cooperative. Reassurance given.

## 2013-12-30 ENCOUNTER — Inpatient Hospital Stay (HOSPITAL_COMMUNITY): Payer: Medicaid Other | Admitting: Anesthesiology

## 2013-12-30 ENCOUNTER — Encounter (HOSPITAL_COMMUNITY): Payer: Self-pay | Admitting: *Deleted

## 2013-12-30 ENCOUNTER — Encounter (HOSPITAL_COMMUNITY): Payer: Medicaid Other | Admitting: Anesthesiology

## 2013-12-30 ENCOUNTER — Encounter (HOSPITAL_COMMUNITY)
Admission: EM | Disposition: A | Discharge status: Home / Self Care | Payer: Self-pay | Source: Home / Self Care | Attending: General Surgery

## 2013-12-30 HISTORY — PX: CHOLECYSTECTOMY: SHX55

## 2013-12-30 LAB — HEPATIC FUNCTION PANEL
ALBUMIN: 3.7 g/dL (ref 3.5–5.2)
ALT: 143 U/L — AB (ref 0–35)
AST: 44 U/L — ABNORMAL HIGH (ref 0–37)
Alkaline Phosphatase: 213 U/L — ABNORMAL HIGH (ref 39–117)
Total Bilirubin: 0.5 mg/dL (ref 0.3–1.2)
Total Protein: 7.4 g/dL (ref 6.0–8.3)

## 2013-12-30 SURGERY — LAPAROSCOPIC CHOLECYSTECTOMY
Anesthesia: General

## 2013-12-30 MED ORDER — ONDANSETRON HCL 4 MG/2ML IJ SOLN
INTRAMUSCULAR | Status: AC
  Filled 2013-12-30: qty 2

## 2013-12-30 MED ORDER — FENTANYL CITRATE 0.05 MG/ML IJ SOLN
INTRAMUSCULAR | Status: DC | PRN
  Administered 2013-12-30 (×6): 50 ug via INTRAVENOUS
  Administered 2013-12-30: 100 ug via INTRAVENOUS

## 2013-12-30 MED ORDER — BUPIVACAINE HCL (PF) 0.5 % IJ SOLN
INTRAMUSCULAR | Status: DC | PRN
  Administered 2013-12-30: 10 mL

## 2013-12-30 MED ORDER — LIDOCAINE HCL (PF) 1 % IJ SOLN
INTRAMUSCULAR | Status: AC
  Filled 2013-12-30: qty 10

## 2013-12-30 MED ORDER — FENTANYL CITRATE 0.05 MG/ML IJ SOLN
INTRAMUSCULAR | Status: AC
  Filled 2013-12-30: qty 5

## 2013-12-30 MED ORDER — KETOROLAC TROMETHAMINE 30 MG/ML IJ SOLN
INTRAMUSCULAR | Status: AC
  Filled 2013-12-30: qty 1

## 2013-12-30 MED ORDER — ONDANSETRON HCL 4 MG/2ML IJ SOLN
4.0000 mg | Freq: Once | INTRAMUSCULAR | Status: AC
  Administered 2013-12-30: 4 mg via INTRAVENOUS

## 2013-12-30 MED ORDER — PROPOFOL 10 MG/ML IV EMUL
INTRAVENOUS | Status: AC
  Filled 2013-12-30: qty 20

## 2013-12-30 MED ORDER — BUPIVACAINE HCL (PF) 0.5 % IJ SOLN
INTRAMUSCULAR | Status: AC
  Filled 2013-12-30: qty 30

## 2013-12-30 MED ORDER — SODIUM CHLORIDE 0.9 % IR SOLN
Status: DC | PRN
  Administered 2013-12-30: 1000 mL

## 2013-12-30 MED ORDER — POVIDONE-IODINE 10 % EX OINT
TOPICAL_OINTMENT | CUTANEOUS | Status: AC
  Filled 2013-12-30: qty 1

## 2013-12-30 MED ORDER — LACTATED RINGERS IV SOLN
INTRAVENOUS | Status: DC | PRN
  Administered 2013-12-30: 07:00:00 via INTRAVENOUS

## 2013-12-30 MED ORDER — GLYCOPYRROLATE 0.2 MG/ML IJ SOLN
0.2000 mg | Freq: Once | INTRAMUSCULAR | Status: AC
  Administered 2013-12-30: 0.2 mg via INTRAVENOUS

## 2013-12-30 MED ORDER — ARTIFICIAL TEARS OP OINT
TOPICAL_OINTMENT | OPHTHALMIC | Status: DC | PRN
  Administered 2013-12-30: 1 via OPHTHALMIC

## 2013-12-30 MED ORDER — KETOROLAC TROMETHAMINE 30 MG/ML IJ SOLN
30.0000 mg | Freq: Once | INTRAMUSCULAR | Status: AC
Start: 2013-12-30 — End: 2013-12-30
  Administered 2013-12-30: 30 mg via INTRAVENOUS

## 2013-12-30 MED ORDER — ROCURONIUM BROMIDE 50 MG/5ML IV SOLN
INTRAVENOUS | Status: AC
  Filled 2013-12-30: qty 1

## 2013-12-30 MED ORDER — PROPOFOL 10 MG/ML IV BOLUS
INTRAVENOUS | Status: DC | PRN
  Administered 2013-12-30: 150 mg via INTRAVENOUS

## 2013-12-30 MED ORDER — SUCCINYLCHOLINE CHLORIDE 20 MG/ML IJ SOLN
INTRAMUSCULAR | Status: AC
  Filled 2013-12-30: qty 1

## 2013-12-30 MED ORDER — GLYCOPYRROLATE 0.2 MG/ML IJ SOLN
INTRAMUSCULAR | Status: AC
  Filled 2013-12-30: qty 3

## 2013-12-30 MED ORDER — MIDAZOLAM HCL 2 MG/2ML IJ SOLN
INTRAMUSCULAR | Status: AC
  Filled 2013-12-30: qty 2

## 2013-12-30 MED ORDER — ENOXAPARIN SODIUM 40 MG/0.4ML ~~LOC~~ SOLN
40.0000 mg | SUBCUTANEOUS | Status: DC
  Filled 2013-12-30: qty 0.4

## 2013-12-30 MED ORDER — MIDAZOLAM HCL 2 MG/2ML IJ SOLN
1.0000 mg | INTRAMUSCULAR | Status: DC | PRN
  Administered 2013-12-30: 2 mg via INTRAVENOUS

## 2013-12-30 MED ORDER — GLYCOPYRROLATE 0.2 MG/ML IJ SOLN
INTRAMUSCULAR | Status: AC
Start: 2013-12-30 — End: 2013-12-30
  Filled 2013-12-30: qty 1

## 2013-12-30 MED ORDER — HEMOSTATIC AGENTS (NO CHARGE) OPTIME
TOPICAL | Status: DC | PRN
  Administered 2013-12-30: 1 via TOPICAL

## 2013-12-30 MED ORDER — FENTANYL CITRATE 0.05 MG/ML IJ SOLN
25.0000 ug | INTRAMUSCULAR | Status: DC | PRN
  Administered 2013-12-30 (×2): 50 ug via INTRAVENOUS

## 2013-12-30 MED ORDER — NEOSTIGMINE METHYLSULFATE 1 MG/ML IJ SOLN
INTRAMUSCULAR | Status: DC | PRN
  Administered 2013-12-30: 4 mg via INTRAVENOUS

## 2013-12-30 MED ORDER — LACTATED RINGERS IV SOLN
INTRAVENOUS | Status: DC
  Administered 2013-12-30: 07:00:00 via INTRAVENOUS

## 2013-12-30 MED ORDER — ENOXAPARIN SODIUM 40 MG/0.4ML ~~LOC~~ SOLN: 40.0000 mg | SUBCUTANEOUS | Status: DC

## 2013-12-30 MED ORDER — ROCURONIUM BROMIDE 100 MG/10ML IV SOLN
INTRAVENOUS | Status: DC | PRN
  Administered 2013-12-30: 35 mg via INTRAVENOUS

## 2013-12-30 MED ORDER — ONDANSETRON HCL 4 MG/2ML IJ SOLN
4.0000 mg | Freq: Once | INTRAMUSCULAR | Status: AC | PRN
  Administered 2013-12-30: 4 mg via INTRAVENOUS

## 2013-12-30 MED ORDER — HYDROCODONE-ACETAMINOPHEN 5-325 MG PO TABS: 1.0000 | ORAL_TABLET | ORAL | Status: DC | PRN

## 2013-12-30 MED ORDER — POVIDONE-IODINE 10 % OINT PACKET
TOPICAL_OINTMENT | CUTANEOUS | Status: DC | PRN
  Administered 2013-12-30: 1 via TOPICAL

## 2013-12-30 MED ORDER — HYDROCODONE-ACETAMINOPHEN 5-325 MG PO TABS: 1.0000 | ORAL_TABLET | Freq: Four times a day (QID) | ORAL | Status: DC | PRN

## 2013-12-30 MED ORDER — FENTANYL CITRATE 0.05 MG/ML IJ SOLN
INTRAMUSCULAR | Status: AC
  Filled 2013-12-30: qty 2

## 2013-12-30 MED ORDER — GLYCOPYRROLATE 0.2 MG/ML IJ SOLN
INTRAMUSCULAR | Status: DC | PRN
  Administered 2013-12-30: 0.6 mg via INTRAVENOUS

## 2013-12-30 SURGICAL SUPPLY — 46 items
APPLIER CLIP LAPSCP 10X32 DD (CLIP) ×3 IMPLANT
BAG HAMPER (MISCELLANEOUS) ×3 IMPLANT
CLOTH BEACON ORANGE TIMEOUT ST (SAFETY) ×3 IMPLANT
COVER LIGHT HANDLE STERIS (MISCELLANEOUS) ×6 IMPLANT
DECANTER SPIKE VIAL GLASS SM (MISCELLANEOUS) ×3 IMPLANT
DRAPE PROXIMA HALF (DRAPES) ×3 IMPLANT
DURAPREP 26ML APPLICATOR (WOUND CARE) ×3 IMPLANT
ELECT REM PT RETURN 9FT ADLT (ELECTROSURGICAL) ×3
ELECTRODE REM PT RTRN 9FT ADLT (ELECTROSURGICAL) ×1 IMPLANT
FILTER SMOKE EVAC LAPAROSHD (FILTER) ×3 IMPLANT
FORMALIN 10 PREFIL 120ML (MISCELLANEOUS) ×3 IMPLANT
GLOVE BIO SURGEON STRL SZ7 (GLOVE) ×3 IMPLANT
GLOVE BIO SURGEON STRL SZ7.5 (GLOVE) ×3 IMPLANT
GLOVE BIOGEL PI IND STRL 7.0 (GLOVE) ×2 IMPLANT
GLOVE BIOGEL PI IND STRL 7.5 (GLOVE) ×1 IMPLANT
GLOVE BIOGEL PI IND STRL 8 (GLOVE) ×1 IMPLANT
GLOVE BIOGEL PI INDICATOR 7.0 (GLOVE) ×4
GLOVE BIOGEL PI INDICATOR 7.5 (GLOVE) ×2
GLOVE BIOGEL PI INDICATOR 8 (GLOVE) ×2
GLOVE SS BIOGEL STRL SZ 6.5 (GLOVE) ×1 IMPLANT
GLOVE SUPERSENSE BIOGEL SZ 6.5 (GLOVE) ×2
GOWN STRL REUS W/TWL LRG LVL3 (GOWN DISPOSABLE) ×9 IMPLANT
HEMOSTAT SNOW SURGICEL 2X4 (HEMOSTASIS) ×3 IMPLANT
INST SET LAPROSCOPIC AP (KITS) ×3 IMPLANT
IV NS IRRIG 3000ML ARTHROMATIC (IV SOLUTION) IMPLANT
KIT ROOM TURNOVER APOR (KITS) ×3 IMPLANT
MANIFOLD NEPTUNE II (INSTRUMENTS) ×3 IMPLANT
NEEDLE INSUFFLATION 14GA 120MM (NEEDLE) ×3 IMPLANT
NS IRRIG 1000ML POUR BTL (IV SOLUTION) ×3 IMPLANT
PACK LAP CHOLE LZT030E (CUSTOM PROCEDURE TRAY) ×3 IMPLANT
PAD ARMBOARD 7.5X6 YLW CONV (MISCELLANEOUS) ×3 IMPLANT
POUCH SPECIMEN RETRIEVAL 10MM (ENDOMECHANICALS) ×3 IMPLANT
SET BASIN LINEN APH (SET/KITS/TRAYS/PACK) ×3 IMPLANT
SET TUBE IRRIG SUCTION NO TIP (IRRIGATION / IRRIGATOR) IMPLANT
SLEEVE ENDOPATH XCEL 5M (ENDOMECHANICALS) ×3 IMPLANT
SPONGE GAUZE 2X2 8PLY STER LF (GAUZE/BANDAGES/DRESSINGS) ×4
SPONGE GAUZE 2X2 8PLY STRL LF (GAUZE/BANDAGES/DRESSINGS) ×8 IMPLANT
STAPLER VISISTAT (STAPLE) ×3 IMPLANT
SUT VICRYL 0 UR6 27IN ABS (SUTURE) ×3 IMPLANT
TAPE CLOTH SURG 4X10 WHT LF (GAUZE/BANDAGES/DRESSINGS) ×3 IMPLANT
TROCAR ENDO BLADELESS 11MM (ENDOMECHANICALS) ×3 IMPLANT
TROCAR XCEL NON-BLD 5MMX100MML (ENDOMECHANICALS) ×3 IMPLANT
TROCAR XCEL UNIV SLVE 11M 100M (ENDOMECHANICALS) ×3 IMPLANT
TUBING INSUFFLATION (TUBING) ×3 IMPLANT
WARMER LAPAROSCOPE (MISCELLANEOUS) ×3 IMPLANT
YANKAUER SUCT 12FT TUBE ARGYLE (SUCTIONS) ×3 IMPLANT

## 2013-12-30 NOTE — Transfer of Care (Signed)
Immediate Anesthesia Transfer of Care Note  Patient: Jacqueline [name]  Procedure(s) Performed: Procedure(s): LAPAROSCOPIC CHOLECYSTECTOMY (N/A)  Patient Location: PACU  Anesthesia Type:General  Level of Consciousness: sedated and patient cooperative  Airway & Oxygen Therapy: Patient Spontanous Breathing and Patient connected to face mask oxygen  Post-op Assessment: Report given to PACU RN and Post -op Vital signs reviewed and stable  Post vital signs: Reviewed and stable  Complications: No apparent anesthesia complications

## 2013-12-30 NOTE — Discharge Instructions (Signed)
Laparoscopic Cholecystectomy, Care After °Refer to this sheet in the next few weeks. These instructions provide you with information on caring for yourself after your procedure. Your health care provider may also give you more specific instructions. Your treatment has been planned according to current medical practices, but problems sometimes occur. Call your health care provider if you have any problems or questions after your procedure. °WHAT TO EXPECT AFTER THE PROCEDURE °After your procedure, it is typical to have the following: °· Pain at your incision sites. You will be given pain medicines to control the pain. °· Mild nausea or vomiting. This should improve after the first 24 hours. °· Bloating and possibly shoulder pain from the gas used during the procedure. This will improve after the first 24 hours. °HOME CARE INSTRUCTIONS  °· Change bandages (dressings) as directed by your health care provider. °· Keep the wound dry and clean. You may wash the wound gently with soap and water. Gently blot or dab the area dry. °· Do not take baths or use swimming pools or hot tubs for 2 weeks or until your health care provider approves. °· Only take over-the-counter or prescription medicines as directed by your health care provider. °· Continue your normal diet as directed by your health care provider. °· Do not lift anything heavier than 10 pounds (4.5 kg) until your health care provider approves. °· Do not play contact sports for 1 week or until your health care provider approves. °SEEK MEDICAL CARE IF:  °· You have redness, swelling, or increasing pain in the wound. °· You notice yellowish-white fluid (pus) coming from the wound. °· You have drainage from the wound that lasts longer than 1 day. °· You notice a bad smell coming from the wound or dressing. °· Your surgical cuts (incisions) break open. °SEEK IMMEDIATE MEDICAL CARE IF:  °· You develop a rash. °· You have difficulty breathing. °· You have chest pain. °· You  have a fever. °· You have increasing pain in the shoulders (shoulder strap areas). °· You have dizzy episodes or faint while standing. °· You have severe abdominal pain. °· You feel sick to your stomach (nauseous) or throw up (vomit) and this lasts for more than 1 day. °Document Released: 09/17/2005 Document Revised: 07/08/2013 Document Reviewed: 04/29/2013 °ExitCare® Patient Information ©2014 [employer]. ° °

## 2013-12-30 NOTE — Anesthesia Procedure Notes (Signed)
Procedure Name: Intubation Date/Time: 12/30/2013 7:38 AM Performed by: Pernell DupreADAMS, Larson Limones A Pre-anesthesia Checklist: Patient identified, Patient being monitored, Timeout performed, Emergency Drugs available and Suction available Patient Re-evaluated:Patient Re-evaluated prior to inductionOxygen Delivery Method: Circle System Utilized Preoxygenation: Pre-oxygenation with 100% oxygen Intubation Type: IV induction Ventilation: Mask ventilation without difficulty Laryngoscope Size: 3 and Miller Grade View: Grade I Tube type: Oral Tube size: 7.0 mm Number of attempts: 1 Airway Equipment and Method: stylet Placement Confirmation: ETT inserted through vocal cords under direct vision,  positive ETCO2 and breath sounds checked- equal and bilateral Secured at: 21 cm Tube secured with: Tape Dental Injury: Teeth and Oropharynx as per pre-operative assessment

## 2013-12-30 NOTE — Op Note (Signed)
Patient:  Jacqueline [name]  DOB:  06-24-1981  MRN:  [id number]   Preop Diagnosis:  Cholecystitis, cholelithiasis  Postop Diagnosis:  Same  Procedure:  Laparoscopic cholecystectomy  Surgeon:  Franky MachoMark Carrington Olazabal, M.D.  Anes:  General endotracheal  Indications:  Patient is a 33 year old black female who presents with cholecystitis secondary to cholelithiasis. She underwent ERCP with stone extraction yesterday by Dr. Jena Gaussourk. She now presents for lap scopic cholecystectomy. Risks and benefits of the procedure including bleeding, infection, hepatobiliary injury, the possibility of an open procedure were fully explained to the patient, who gave informed consent.  Procedure note:  The patient is placed the supine position. After induction of general endotracheal anesthesia, the abdomen was prepped and draped using usual sterile technique with DuraPrep. Surgical site confirmation was performed.  An infraumbilical incision was made down to the fascia. A Veress needle was introduced into the abdominal cavity and confirmation of placement was done using the saline drop test. The abdomen was then insufflated to 16 mm mercury pressure. An 11 mm trocar was introduced into the abdominal cavity under direct visualization without difficulty. Patient was placed in reverse Trendelenburg position and additional 11 mm trocar was placed the epigastric region and 5 mm trochars were placed the right upper quadrant right flank regions. Liver was inspected and noted to be within normal limits. The serum adhesions to the anterior abdominal wall these were lysed sharply without difficulty. The gallbladder was retracted in a dynamic fashion in order to expose the triangle of calot. The cystic duct was first identified. Its junction to the infundibulum was fully identified. Endoclips were placed proximally and distally on the cystic duct, and the cystic duct was divided. This was likewise done cystic artery. The gallbladder was then  freed away from the gallbladder fossa using Bovie electrocautery. The gallbladder was delivered through the epigastric trocar site using an Endo Catch bag. The gallbladder fossa was inspected and no abnormal bleeding or bile leakage was noted. Surgicel is placed the gallbladder fossa. All fluid and air were then evacuated from the abdominal cavity prior to removal of the trochars.  All wounds were irrigated with normal saline. All wounds were checked with 0.5% Sensorcaine. The infraumbilical fascia was reapproximated using 0 Vicryl interrupted suture. All skin incisions were closed using staples. Betadine ointment and dry sterile dressings were applied.  All tape and needle counts were correct the end of the procedure. Patient was extubated in the operating room and transferred to PACU in stable condition.  Complications:  None  EBL:  Minimal  Specimen:  Gallbladder

## 2013-12-30 NOTE — Anesthesia Postprocedure Evaluation (Signed)
  Anesthesia Post-op Note  Patient: Jacqueline [name]  Procedure(s) Performed: Procedure(s): LAPAROSCOPIC CHOLECYSTECTOMY (N/A)  Patient Location: PACU  Anesthesia Type:General  Level of Consciousness: awake, alert , oriented and patient cooperative  Airway and Oxygen Therapy: Patient Spontanous Breathing and Patient connected to face mask oxygen  Post-op Pain: mild  Post-op Assessment: Post-op Vital signs reviewed, Patient's Cardiovascular Status Stable, Respiratory Function Stable, Patent Airway, No signs of Nausea or vomiting and Pain level controlled  Post-op Vital Signs: Reviewed and stable  Complications: No apparent anesthesia complications

## 2013-12-30 NOTE — Anesthesia Preprocedure Evaluation (Signed)
Anesthesia Evaluation  Patient identified by MRN, date of birth, ID band Patient awake    Reviewed: Allergy & Precautions, H&P , NPO status , Patient's Chart, lab work & pertinent test results  Airway Mallampati: II TM Distance: >3 FB Neck ROM: Full    Dental  (+) Teeth Intact   Pulmonary former smoker,  breath sounds clear to auscultation        Cardiovascular negative cardio ROS  Rhythm:Regular Rate:Normal     Neuro/Psych Depression    GI/Hepatic negative GI ROS,   Endo/Other    Renal/GU      Musculoskeletal   Abdominal   Peds  Hematology   Anesthesia Other Findings   Reproductive/Obstetrics                           Anesthesia Physical Anesthesia Plan  ASA: II  Anesthesia Plan: General   Post-op Pain Management:    Induction: Intravenous  Airway Management Planned: Oral ETT  Additional Equipment:   Intra-op Plan:   Post-operative Plan: Extubation in OR  Informed Consent: I have reviewed the patients History and Physical, chart, labs and discussed the procedure including the risks, benefits and alternatives for the proposed anesthesia with the patient or authorized representative who has indicated his/her understanding and acceptance.     Plan Discussed with:   Anesthesia Plan Comments:         Anesthesia Quick Evaluation

## 2013-12-31 LAB — BASIC METABOLIC PANEL
BUN: 7 mg/dL (ref 6–23)
CHLORIDE: 104 meq/L (ref 96–112)
CO2: 28 mEq/L (ref 19–32)
Calcium: 9.4 mg/dL (ref 8.4–10.5)
Creatinine, Ser: 0.74 mg/dL (ref 0.50–1.10)
GFR calc non Af Amer: >90 mL/min (ref >90)
Glucose, Bld: 106 mg/dL — ABNORMAL HIGH (ref 70–99)
POTASSIUM: 4 meq/L (ref 3.7–5.3)
SODIUM: 139 meq/L (ref 137–147)

## 2013-12-31 LAB — CBC
HCT: 31.5 % — ABNORMAL LOW (ref 36.0–46.0)
Hemoglobin: 10.5 g/dL — ABNORMAL LOW (ref 12.0–15.0)
MCH: 26.8 pg (ref 26.0–34.0)
MCHC: 33.3 g/dL (ref 30.0–36.0)
MCV: 80.4 fL (ref 78.0–100.0)
PLATELETS: 253 10*3/uL (ref 150–400)
RBC: 3.92 MIL/uL (ref 3.87–5.11)
RDW: 14.2 % (ref 11.5–15.5)
WBC: 5.6 10*3/uL (ref 4.0–10.5)

## 2013-12-31 MED ORDER — OXYCODONE-ACETAMINOPHEN 5-325 MG PO TABS
1.0000 | ORAL_TABLET | ORAL | Status: DC | PRN
  Administered 2013-12-31: 2 via ORAL
  Filled 2013-12-31: qty 2

## 2013-12-31 MED ORDER — OXYCODONE-ACETAMINOPHEN 7.5-325 MG PO TABS: 1.0000 | ORAL_TABLET | ORAL | Status: DC | PRN

## 2013-12-31 NOTE — Discharge Summary (Signed)
Physician Discharge Summary  Patient ID: Jacqueline [name] MRN: [id number] DOB/AGE: [birth year] 32 y.o.  Admit date: 12/27/2013 Discharge date: 12/31/2013  Admission Diagnoses: Cholecystitis, cholelithiasis, choledocholithiasis  Discharge Diagnoses: Same Active Problems:   Abdominal pain   Discharged Condition: good  Hospital Course: Patient is a 33 year old black female who was admitted to the hospital on 12/27/2013 with worsening right upper quadrant abdominal pain. She was noted to have elevated liver enzyme tests as well as hyperbilirubinemia. She did have an elevated direct component. Ultrasound of gallbladder revealed cholelithiasis but no choledocholithiasis. She underwent an MRCP which would did reveal choledocholithiasis.  Dr. Jena Gaussourk of GI was consulted and performed an ERCP on 12/29/2013. The common bile duct stone was removed. She tolerated that procedure well. She stopped them in a while laparoscopic cholecystectomy on 12/30/2013. She tolerated that surgery well. Her diet was advanced without difficulty. She is being discharged home on postoperative day one in good improving condition. She has been told that she cannot breast feed while on narcotics.  Treatments: surgery: ERCP with stone extraction on 12/29/2013, laparoscopic cholecystectomy on 12/30/2013.  Discharge Exam: Blood pressure 113/51, pulse 57, temperature 97.9 F (36.6 C), temperature source Oral, resp. rate 19, height 6' (1.829 m), weight 75.751 kg (167 lb), last menstrual period 12/13/2013, SpO2 99.00%. General appearance: alert, cooperative and no distress Resp: clear to auscultation bilaterally Cardio: regular rate and rhythm, S1, S2 normal, no murmur, click, rub or gallop GI: Soft. Dressings dry and intact.  Disposition: 01-Home or Self Care   Future Appointments Provider Department Dept Phone   02/08/2014 9:30 AM [place] Nurse Tech [facility] [phone]       Medication List         acyclovir  400 MG tablet  Commonly known as:  ZOVIRAX  Take 400 mg by mouth 2 (two) times daily.     cholecalciferol 1000 UNITS tablet  Commonly known as:  VITAMIN D  Take 1,000 Units by mouth daily.     escitalopram 20 MG tablet  Commonly known as:  LEXAPRO  Take 20 mg by mouth daily.     ferrous sulfate 325 (65 FE) MG tablet  Take 325 mg by mouth daily with breakfast.     FLUoxetine 20 MG capsule  Commonly known as:  PROZAC  Take 20 mg by mouth daily.     oxyCODONE-acetaminophen 7.5-325 MG per tablet  Commonly known as:  PERCOCET  Take 1-2 tablets by mouth every 4 (four) hours as needed.           Follow-up Information   Follow up with Dalia HeadingJENKINS,Maziah Keeling A, MD On 01/07/2014. (at 9:15 am)    Specialty:  General Surgery   Contact information:   [address] [place] [address] [phone]       Signed: Franky MachoJENKINS,Avriana Joo A 12/31/2013, 9:09 AM

## 2013-12-31 NOTE — Progress Notes (Signed)
Pt discharged home today per Dr. Jenkins. Pt's IV site D/C'd and WNL. Pt's VSS. Pt provided with home medication list, discharge instructions and prescriptions. Verbalized understanding. Pt left floor via WC in stable condition accompanied by NT. 

## 2013-12-31 NOTE — Addendum Note (Signed)
Addendum created 12/31/13 1404 by Earleen NewportAmy A Adams, CRNA   Modules edited: Anesthesia Flowsheet

## 2013-12-31 NOTE — Addendum Note (Signed)
Addendum created 12/31/13 1353 by Earleen NewportAmy A Jonan Seufert, CRNA   Modules edited: Anesthesia Flowsheet

## 2014-01-05 ENCOUNTER — Encounter (HOSPITAL_COMMUNITY): Payer: Self-pay | Admitting: General Surgery

## 2014-02-08 ENCOUNTER — Ambulatory Visit: Payer: Medicaid Other

## 2014-08-02 ENCOUNTER — Encounter (HOSPITAL_COMMUNITY): Payer: Self-pay | Admitting: General Surgery

## 2014-09-14 ENCOUNTER — Emergency Department (HOSPITAL_COMMUNITY): Payer: Medicaid Other

## 2014-09-14 ENCOUNTER — Emergency Department (HOSPITAL_COMMUNITY)
Admission: EM | Admit: 2014-09-14 | Discharge: 2014-09-15 | Disposition: A | Discharge status: Home / Self Care | Payer: Medicaid Other | Attending: Emergency Medicine | Admitting: Emergency Medicine

## 2014-09-14 ENCOUNTER — Encounter (HOSPITAL_COMMUNITY): Payer: Self-pay | Admitting: *Deleted

## 2014-09-14 DIAGNOSIS — Z8619 Personal history of other infectious and parasitic diseases: Secondary | ICD-10-CM | POA: Insufficient documentation

## 2014-09-14 DIAGNOSIS — Z880 Allergy status to penicillin: Secondary | ICD-10-CM | POA: Diagnosis not present

## 2014-09-14 DIAGNOSIS — Z9889 Other specified postprocedural states: Secondary | ICD-10-CM | POA: Diagnosis not present

## 2014-09-14 DIAGNOSIS — Z9049 Acquired absence of other specified parts of digestive tract: Secondary | ICD-10-CM | POA: Diagnosis not present

## 2014-09-14 DIAGNOSIS — Z3202 Encounter for pregnancy test, result negative: Secondary | ICD-10-CM | POA: Insufficient documentation

## 2014-09-14 DIAGNOSIS — Z8669 Personal history of other diseases of the nervous system and sense organs: Secondary | ICD-10-CM | POA: Insufficient documentation

## 2014-09-14 DIAGNOSIS — Z720 Tobacco use: Secondary | ICD-10-CM | POA: Diagnosis not present

## 2014-09-14 DIAGNOSIS — Z87440 Personal history of urinary (tract) infections: Secondary | ICD-10-CM | POA: Insufficient documentation

## 2014-09-14 DIAGNOSIS — R1011 Right upper quadrant pain: Secondary | ICD-10-CM | POA: Diagnosis not present

## 2014-09-14 DIAGNOSIS — Z79899 Other long term (current) drug therapy: Secondary | ICD-10-CM | POA: Diagnosis not present

## 2014-09-14 DIAGNOSIS — R109 Unspecified abdominal pain: Secondary | ICD-10-CM | POA: Diagnosis present

## 2014-09-14 DIAGNOSIS — R112 Nausea with vomiting, unspecified: Secondary | ICD-10-CM | POA: Insufficient documentation

## 2014-09-14 LAB — URINALYSIS, ROUTINE W REFLEX MICROSCOPIC
BILIRUBIN URINE: NEGATIVE
Glucose, UA: NEGATIVE mg/dL
HGB URINE DIPSTICK: NEGATIVE
KETONES UR: NEGATIVE mg/dL
Leukocytes, UA: NEGATIVE
NITRITE: NEGATIVE
PH: 7.5 (ref 5.0–8.0)
Protein, ur: NEGATIVE mg/dL
Specific Gravity, Urine: 1.02 (ref 1.005–1.030)
Urobilinogen, UA: 0.2 mg/dL (ref 0.0–1.0)

## 2014-09-14 LAB — COMPREHENSIVE METABOLIC PANEL
ALK PHOS: 123 U/L — AB (ref 39–117)
ALT: 15 U/L (ref 0–35)
AST: 20 U/L (ref 0–37)
Albumin: 4.1 g/dL (ref 3.5–5.2)
Anion gap: 11 (ref 5–15)
BUN: 14 mg/dL (ref 6–23)
CHLORIDE: 102 meq/L (ref 96–112)
CO2: 28 mEq/L (ref 19–32)
CREATININE: 0.82 mg/dL (ref 0.50–1.10)
Calcium: 9.8 mg/dL (ref 8.4–10.5)
GFR calc Af Amer: >90 mL/min (ref >90)
Glucose, Bld: 87 mg/dL (ref 70–99)
POTASSIUM: 4.1 meq/L (ref 3.7–5.3)
Sodium: 141 mEq/L (ref 137–147)
Total Protein: 8 g/dL (ref 6.0–8.3)

## 2014-09-14 LAB — CBC WITH DIFFERENTIAL/PLATELET
BASOS ABS: 0 10*3/uL (ref 0.0–0.1)
Basophils Relative: 0 % (ref 0–1)
Eosinophils Absolute: 0.4 10*3/uL (ref 0.0–0.7)
Eosinophils Relative: 6 % — ABNORMAL HIGH (ref 0–5)
HEMATOCRIT: 34.8 % — AB (ref 36.0–46.0)
HEMOGLOBIN: 11.3 g/dL — AB (ref 12.0–15.0)
LYMPHS PCT: 51 % — AB (ref 12–46)
Lymphs Abs: 3.5 10*3/uL (ref 0.7–4.0)
MCH: 26.5 pg (ref 26.0–34.0)
MCHC: 32.5 g/dL (ref 30.0–36.0)
MCV: 81.7 fL (ref 78.0–100.0)
MONO ABS: 0.8 10*3/uL (ref 0.1–1.0)
Monocytes Relative: 11 % (ref 3–12)
Neutro Abs: 2.2 10*3/uL (ref 1.7–7.7)
Neutrophils Relative %: 32 % — ABNORMAL LOW (ref 43–77)
Platelets: 278 10*3/uL (ref 150–400)
RBC: 4.26 MIL/uL (ref 3.87–5.11)
RDW: 14.6 % (ref 11.5–15.5)
WBC: 6.9 10*3/uL (ref 4.0–10.5)

## 2014-09-14 LAB — LIPASE, BLOOD: Lipase: 16 U/L (ref 11–59)

## 2014-09-14 LAB — PREGNANCY, URINE: Preg Test, Ur: NEGATIVE

## 2014-09-14 MED ORDER — SODIUM CHLORIDE 0.9 % IV SOLN: INTRAVENOUS | Status: DC

## 2014-09-14 MED ORDER — ONDANSETRON HCL 4 MG/2ML IJ SOLN
4.0000 mg | Freq: Once | INTRAMUSCULAR | Status: AC
  Administered 2014-09-14: 4 mg via INTRAVENOUS
  Filled 2014-09-14: qty 2

## 2014-09-14 MED ORDER — KETOROLAC TROMETHAMINE 30 MG/ML IJ SOLN
30.0000 mg | Freq: Once | INTRAMUSCULAR | Status: AC
  Administered 2014-09-14: 30 mg via INTRAVENOUS
  Filled 2014-09-14: qty 1

## 2014-09-14 MED ORDER — SODIUM CHLORIDE 0.9 % IV BOLUS (SEPSIS)
1000.0000 mL | Freq: Once | INTRAVENOUS | Status: AC
  Administered 2014-09-14: 1000 mL via INTRAVENOUS

## 2014-09-14 NOTE — ED Notes (Signed)
Rt abd pain, onset today, N/v,  Had GB removed in March, says this feel like same pain

## 2014-09-14 NOTE — ED Provider Notes (Signed)
CSN: [id number]     Arrival date & time 09/14/14  2118 History  This chart was scribed for Ward GivensIva L Leyton Magoon, MD by Milly JakobJohn Lee Graves, ED Scribe. The patient was seen in room APA19/APA19. Patient's care was started at 11:05 PM.     Chief Complaint  Patient presents with  . Abdominal Pain   The history is provided by the patient. No language interpreter was used.   HPI Comments: Jacqueline [name] is a 33 y.o. female who presents to the Emergency Department complaining of severe, right sided abdominal pain onset about 11 hours ago. Jacqueline [name] states that the pain is exacerbated by movement and deep breathing. Jacqueline [name] also reports nausea and vomiting including one episode of vomiting. Jacqueline [name] states that Jacqueline [name] often vomits when Jacqueline [name] eats corn, and this was not unusual to her as Jacqueline [name] had corn for lunch today. Jacqueline [name] reports moving heavy objects around her house recently getting Christmas decorations in large storage boxes. Jacqueline [name] states that Jacqueline [name] had symptoms like this in the past when Jacqueline [name] had her gallbladder removed. Jacqueline [name] denies fever, diarrhea, SOB, or cough. Jacqueline [name] reports Jacqueline [name] ate dinner without making the pain feel worse. Jacqueline [name] is not on any hormones or birth control pills.   Jacqueline [name] still has her appendix. Jacqueline [name] reports taking Zoloft, Iron, Vitamin D, and prenatal plus vitamin. Jacqueline [name] reports a recent UTI for which Jacqueline [name] took a complete course of ABX. Jacqueline [name] reports a recent pregnancy with a healthy 13 month baby that Jacqueline [name] is still breast feeding. Jacqueline [name] smokes 1 cigar per day. 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   [place] [facility] [place]  Past Medical History  Diagnosis Date  . Herpes simplex without mention of complication   . Neuromuscular disorder     Nerve damage in back and arm   Past Surgical History  Procedure Laterality Date  . None    . Endoscopic retrograde cholangiopancreatography (ercp) with propofol  2015  . Ercp N/A 12/29/2013    Procedure: ENDOSCOPIC RETROGRADE CHOLANGIOPANCREATOGRAPHY  (ERCP);  Surgeon: Corbin Adeobert M Rourk, MD;  Location: [facility];  Service: Endoscopy;  Laterality: N/A;  and stone extraction  . Sphincterotomy N/A 12/29/2013    Procedure: SPHINCTEROTOMY;  Surgeon: Corbin Adeobert M Rourk, MD;  Location: [facility];  Service: Endoscopy;  Laterality: N/A;  . Cholecystectomy N/A 12/30/2013    Procedure: LAPAROSCOPIC CHOLECYSTECTOMY;  Surgeon: Dalia HeadingMark A Jenkins, MD;  Location: [facility];  Service: General;  Laterality: N/A;   Family History  Problem Relation Age of Onset  . Colon cancer Neg Hx    History  Substance Use Topics  . Smoking status: Current Every Day Smoker -- 0.25 packs/day for 2 years    Last Attempt to Quit: 09/30/2012  . Smokeless tobacco: Not on file  . 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  Respiratory: Negative for choking and shortness of breath.   Gastrointestinal: Positive for nausea, vomiting and abdominal pain. Negative for diarrhea.  All other systems reviewed and are negative.  Allergies  Penicillins  Home Medications   Prior to Admission medications   Medication Sig Start Date End Date Taking? Authorizing Provider  cholecalciferol (VITAMIN D) 1000 UNITS tablet  Take 1,000 Units by mouth daily.   Yes Historical Provider, MD  ferrous sulfate 325 (65 FE) MG tablet Take 325 mg by mouth daily with breakfast.   Yes Historical Provider, MD  sertraline (ZOLOFT) 100 MG tablet Take 100 mg by mouth daily.   Yes Historical Provider, MD  cyclobenzaprine (FLEXERIL) 5 MG tablet Take 1 tablet (5 mg total) by mouth 3 (three) times daily as needed for muscle spasms. 09/15/14   Ward GivensIva L Kary Colaizzi, MD  naproxen (NAPROSYN) 375 MG tablet Take 1 tablet (375 mg total) by mouth 2 (two) times daily. 09/15/14   Ward GivensIva L Emogene Muratalla, MD  omeprazole (PRILOSEC) 20 MG capsule Take 1 po BID x 2 weeks then once a day  09/15/14   Ward GivensIva L Catlyn Shipton, MD  ondansetron (ZOFRAN) 4 MG tablet Take 1 tablet (4 mg total) by mouth every 8 (eight) hours as needed for nausea or vomiting. 09/15/14   Ward GivensIva L Majesti Gambrell, MD  oxyCODONE-acetaminophen (PERCOCET) 7.5-325 MG per tablet Take 1-2 tablets by mouth every 4 (four) hours as needed. Patient not taking: Reported on 09/14/2014 12/31/13   Dalia HeadingMark A Jenkins, MD   PNV +  Triage Vitals: BP 129/58 mmHg  Pulse 77  Temp(Src) 98.4 F (36.9 C) (Oral)  Resp 18  Ht 6' (1.829 m)  Wt 171 lb (77.565 kg)  BMI 23.19 kg/m2  SpO2 100%  LMP 09/14/2014  Vital signs normal   Physical Exam  Constitutional: Jacqueline [name] is oriented to person, place, and time. Jacqueline [name] appears well-developed and well-nourished.  Non-toxic appearance. Jacqueline [name] does not appear ill. No distress.  HENT:  Head: Normocephalic and atraumatic.  Right Ear: External ear normal.  Left Ear: External ear normal.  Nose: Nose normal. No mucosal edema or rhinorrhea.  Mouth/Throat: Oropharynx is clear and moist and mucous membranes are normal. No dental abscesses or uvula swelling.  Eyes: Conjunctivae and EOM are normal. Pupils are equal, round, and reactive to light.  Neck: Normal range of motion and full passive range of motion without pain. Neck supple.  Cardiovascular: Normal rate, regular rhythm and normal heart sounds.  Exam reveals no gallop and no friction rub.   No murmur heard. Pulmonary/Chest: Effort normal and breath sounds normal. No respiratory distress. Jacqueline [name] has no wheezes. Jacqueline [name] has no rhonchi. Jacqueline [name] has no rales. Jacqueline [name] exhibits no tenderness and no crepitus.  Abdominal: Soft. Normal appearance and bowel sounds are normal. Jacqueline [name] exhibits no distension. There is tenderness. There is no rebound and no guarding.    Tender epigastric, RUQ, and RLQ, but worst in RUQ  Musculoskeletal: Normal range of motion. Jacqueline [name] exhibits no edema or tenderness.  Moves all extremities well.   Neurological: Jacqueline [name] is alert and oriented to person, place, and time. Jacqueline [name]  has normal strength. No cranial nerve deficit.  Skin: Skin is warm, dry and intact. No rash noted. No erythema. No pallor.  Psychiatric: Jacqueline [name] has a normal mood and affect. Her speech is normal and behavior is normal. Her mood appears not anxious.  Nursing note and vitals reviewed.   ED Course  Procedures (including critical care time)  Medications  0.9 %  sodium chloride infusion (not administered)  sodium chloride 0.9 % bolus 1,000 mL (0 mLs Intravenous Stopped 09/15/14 0104)  ketorolac (TORADOL) 30 MG/ML injection 30 mg (30 mg Intravenous Given 09/14/14 2342)  ondansetron (ZOFRAN) injection 4 mg (4 mg Intravenous Given 09/14/14 2342)  iohexol (OMNIPAQUE) 300 MG/ML solution 25 mL (25 mLs Oral Contrast Given 09/15/14 0046)  iohexol (OMNIPAQUE) 300 MG/ML  solution 100 mL (100 mLs Intravenous Contrast Given 09/15/14 0046)  cyclobenzaprine (FLEXERIL) tablet 10 mg (10 mg Oral Given 09/15/14 0322)    DIAGNOSTIC STUDIES: Oxygen Saturation is 100% on room air, normal by my interpretation.    COORDINATION OF CARE: 11:16 PM-Discussed treatment plan which includes UA, Abdominal CT-scan, lab work, CXR, and sodium chloride with pt at bedside and pt agreed to plan.   Patient states her pain is improving but still present. Patient was given the results of her tests.  Labs Review Results for orders placed or performed during the hospital encounter of 09/14/14  CBC with Differential  Result Value Ref Range   WBC 6.9 4.0 - 10.5 K/uL   RBC 4.26 3.87 - 5.11 MIL/uL   Hemoglobin 11.3 (L) 12.0 - 15.0 g/dL   HCT 16.1 (L) 09.6 - 04.5 %   MCV 81.7 78.0 - 100.0 fL   MCH 26.5 26.0 - 34.0 pg   MCHC 32.5 30.0 - 36.0 g/dL   RDW 40.9 81.1 - 91.4 %   Platelets 278 150 - 400 K/uL   Neutrophils Relative % 32 (L) 43 - 77 %   Neutro Abs 2.2 1.7 - 7.7 K/uL   Lymphocytes Relative 51 (H) 12 - 46 %   Lymphs Abs 3.5 0.7 - 4.0 K/uL   Monocytes Relative 11 3 - 12 %   Monocytes Absolute 0.8 0.1 - 1.0 K/uL    Eosinophils Relative 6 (H) 0 - 5 %   Eosinophils Absolute 0.4 0.0 - 0.7 K/uL   Basophils Relative 0 0 - 1 %   Basophils Absolute 0.0 0.0 - 0.1 K/uL  Comprehensive metabolic panel  Result Value Ref Range   Sodium 141 137 - 147 mEq/L   Potassium 4.1 3.7 - 5.3 mEq/L   Chloride 102 96 - 112 mEq/L   CO2 28 19 - 32 mEq/L   Glucose, Bld 87 70 - 99 mg/dL   BUN 14 6 - 23 mg/dL   Creatinine, Ser 7.82 0.50 - 1.10 mg/dL   Calcium 9.8 8.4 - 95.6 mg/dL   Total Protein 8.0 6.0 - 8.3 g/dL   Albumin 4.1 3.5 - 5.2 g/dL   AST 20 0 - 37 U/L   ALT 15 0 - 35 U/L   Alkaline Phosphatase 123 (H) 39 - 117 U/L   Total Bilirubin <0.2 (L) 0.3 - 1.2 mg/dL   GFR calc non Af Amer >90 >90 mL/min   GFR calc Af Amer >90 >90 mL/min   Anion gap 11 5 - 15  Lipase, blood  Result Value Ref Range   Lipase 16 11 - 59 U/L  Urinalysis, Routine w reflex microscopic  Result Value Ref Range   Color, Urine YELLOW YELLOW   APPearance CLEAR CLEAR   Specific Gravity, Urine 1.020 1.005 - 1.030   pH 7.5 5.0 - 8.0   Glucose, UA NEGATIVE NEGATIVE mg/dL   Hgb urine dipstick NEGATIVE NEGATIVE   Bilirubin Urine NEGATIVE NEGATIVE   Ketones, ur NEGATIVE NEGATIVE mg/dL   Protein, ur NEGATIVE NEGATIVE mg/dL   Urobilinogen, UA 0.2 0.0 - 1.0 mg/dL   Nitrite NEGATIVE NEGATIVE   Leukocytes, UA NEGATIVE NEGATIVE  Pregnancy, urine  Result Value Ref Range   Preg Test, Ur NEGATIVE NEGATIVE  D-dimer, quantitative  Result Value Ref Range   D-Dimer, Quant <0.27 0.00 - 0.48 ug/mL-FEU   Laboratory interpretation all normal except mild anemia     Imaging Review Dg Chest 2 View  09/15/2014   CLINICAL DATA:  Right lower chest pain.  EXAM: CHEST  2 VIEW  COMPARISON:  May 27, 2011.  FINDINGS: The heart size and mediastinal contours are within normal limits. Both lungs are clear. No pneumothorax or pleural effusion is noted. The visualized skeletal structures are unremarkable.  IMPRESSION: No acute cardiopulmonary abnormality seen.    Electronically Signed   By: Roque Lias M.D.   On: 09/15/2014 01:22   Ct Abdomen Pelvis W Contrast  09/15/2014   CLINICAL DATA:  Right upper quadrant pain for 11 hours  EXAM: CT ABDOMEN AND PELVIS WITH CONTRAST  TECHNIQUE: Multidetector CT imaging of the abdomen and pelvis was performed using the standard protocol following bolus administration of intravenous contrast.  CONTRAST:  25mL OMNIPAQUE IOHEXOL 300 MG/ML SOLN, OMNIPAQUE IOHEXOL 300 MG/ML SOLN  COMPARISON:  12/27/2013  FINDINGS: BODY WALL: Unremarkable.  LOWER CHEST: Trace anterior pericardial fluid, stable from prior.  Calcified granuloma in the right lower lobe.  ABDOMEN/PELVIS:  Liver: No focal abnormality.  Biliary: Cholecystectomy. No fluid in the gallbladder fossa. No biliary ductal enlargement. No calcified choledocholithiasis (note that previous choledocholithiasis was noncalcified).  Pancreas: Unremarkable.  Spleen: Unremarkable.  Adrenals: Unremarkable.  Kidneys and ureters: No hydronephrosis or stone.  Bladder: Unremarkable.  Reproductive: Large, refluxing left ovarian vein.  Bowel: No obstruction. Normal appendix.  Retroperitoneum: No mass or adenopathy.  Peritoneum: No ascites or pneumoperitoneum.  Vascular: No acute abnormality.  OSSEOUS: No acute abnormalities.  IMPRESSION: 1. No acute intra-abdominal findings. 2. Cholecystectomy.  No bile duct enlargement.   Electronically Signed   By: Tiburcio Pea M.D.   On: 09/15/2014 02:29     EKG Interpretation None      MDM  patient presents with pleuritic type abdominal pain after moving heavy boxes of Christmas decorations. Her pain is most likely abdominal wall strain. Her d-dimer is negative and Jacqueline [name] is not on birth control pills.    Final diagnoses:  Abdominal pain, acute, right upper quadrant    Discharge Medication List as of 09/15/2014  3:45 AM    START taking these medications   Details  cyclobenzaprine (FLEXERIL) 5 MG tablet Take 1 tablet (5 mg total) by mouth 3  (three) times daily as needed for muscle spasms., Starting 09/15/2014, Until Discontinued, Print    naproxen (NAPROSYN) 375 MG tablet Take 1 tablet (375 mg total) by mouth 2 (two) times daily., Starting 09/15/2014, Until Discontinued, Print    omeprazole (PRILOSEC) 20 MG capsule Take 1 po BID x 2 weeks then once a day, Print    ondansetron (ZOFRAN) 4 MG tablet Take 1 tablet (4 mg total) by mouth every 8 (eight) hours as needed for nausea or vomiting., Starting 09/15/2014, Until Discontinued, Print        Plan discharge    I personally performed the services described in this documentation, which was scribed in my presence. The recorded information has been reviewed and considered.  Devoria Albe, MD, FACEP    Ward Givens, MD 09/15/14 [phone]

## 2014-09-15 LAB — D-DIMER, QUANTITATIVE (NOT AT ARMC)

## 2014-09-15 MED ORDER — IOHEXOL 300 MG/ML  SOLN
25.0000 mL | Freq: Once | INTRAMUSCULAR | Status: AC | PRN
  Administered 2014-09-15: 25 mL via ORAL

## 2014-09-15 MED ORDER — CYCLOBENZAPRINE HCL 5 MG PO TABS: 5.0000 mg | ORAL_TABLET | Freq: Three times a day (TID) | ORAL | Status: DC | PRN

## 2014-09-15 MED ORDER — ONDANSETRON HCL 4 MG PO TABS: 4.0000 mg | ORAL_TABLET | Freq: Three times a day (TID) | ORAL | Status: DC | PRN

## 2014-09-15 MED ORDER — OMEPRAZOLE 20 MG PO CPDR
DELAYED_RELEASE_CAPSULE | ORAL | Status: DC
Start: 2014-09-15 — End: 2014-09-16

## 2014-09-15 MED ORDER — KETOROLAC TROMETHAMINE 30 MG/ML IJ SOLN: 30.0000 mg | Freq: Once | INTRAMUSCULAR | Status: DC

## 2014-09-15 MED ORDER — CYCLOBENZAPRINE HCL 10 MG PO TABS
10.0000 mg | ORAL_TABLET | Freq: Once | ORAL | Status: AC
  Administered 2014-09-15: 10 mg via ORAL
  Filled 2014-09-15: qty 1

## 2014-09-15 MED ORDER — IOHEXOL 300 MG/ML  SOLN
100.0000 mL | Freq: Once | INTRAMUSCULAR | Status: AC | PRN
  Administered 2014-09-15: 100 mL via INTRAVENOUS

## 2014-09-15 MED ORDER — NAPROXEN 375 MG PO TABS: 375.0000 mg | ORAL_TABLET | Freq: Two times a day (BID) | ORAL | Status: DC

## 2014-09-15 NOTE — Discharge Instructions (Signed)
Take the medications as prescribed. Recheck if you get worse such as fever, uncontrolled vomiting or worsening pain.

## 2014-09-16 ENCOUNTER — Encounter: Payer: Self-pay | Admitting: Adult Health

## 2014-09-16 ENCOUNTER — Ambulatory Visit (INDEPENDENT_AMBULATORY_CARE_PROVIDER_SITE_OTHER): Payer: Medicaid Other | Admitting: Adult Health

## 2014-09-16 VITALS — BP 110/68 | Ht 72.0 in | Wt 181.0 lb

## 2014-09-16 DIAGNOSIS — N898 Other specified noninflammatory disorders of vagina: Secondary | ICD-10-CM | POA: Insufficient documentation

## 2014-09-16 DIAGNOSIS — R809 Proteinuria, unspecified: Secondary | ICD-10-CM

## 2014-09-16 DIAGNOSIS — R1030 Lower abdominal pain, unspecified: Secondary | ICD-10-CM

## 2014-09-16 DIAGNOSIS — Z3202 Encounter for pregnancy test, result negative: Secondary | ICD-10-CM

## 2014-09-16 DIAGNOSIS — K5900 Constipation, unspecified: Secondary | ICD-10-CM

## 2014-09-16 HISTORY — DX: Other specified noninflammatory disorders of vagina: N89.8

## 2014-09-16 HISTORY — DX: Constipation, unspecified: K59.00

## 2014-09-16 LAB — POCT URINALYSIS DIPSTICK
Blood, UA: NEGATIVE
GLUCOSE UA: NEGATIVE
Leukocytes, UA: NEGATIVE
NITRITE UA: NEGATIVE

## 2014-09-16 LAB — POCT WET PREP (WET MOUNT)
Trichomonas Wet Prep HPF POC: NEGATIVE
WBC WET PREP: NEGATIVE

## 2014-09-16 LAB — POCT URINE PREGNANCY: PREG TEST UR: NEGATIVE

## 2014-09-16 MED ORDER — NITROFURANTOIN MONOHYD MACRO 100 MG PO CAPS: 100.0000 mg | ORAL_CAPSULE | Freq: Two times a day (BID) | ORAL | Status: DC

## 2014-09-16 NOTE — Progress Notes (Signed)
Subjective:     Patient ID: Jacqueline [name], female   DOB: 1981/06/16, 33 y.o.   MRN: [id number]  HPI [name] is a 33 year old black female in complaining of vaginal irritation and some low abdominal pain.She has not had BM in 3 days.She is still breastfeeding 4013 month old some.  Review of Systems See HPI Reviewed past medical,surgical, social and family history. Reviewed medications and allergies.     Objective:   Physical Exam BP 110/68 mmHg  Ht 6' (1.829 m)  Wt 181 lb (82.101 kg)  BMI 24.54 kg/m2  LMP 03/15/2014 (Approximate)UPT negative, urine 1+ protein,Skin warm and dry.Pelvic: external genitalia is normal in appearance, vagina: white discharge without odor, cervix:smooth and bulbous, uterus: normal size, shape and contour, mildly tender over uterus and bladder, no masses felt, adnexa: no masses or tenderness noted. Wet prep: negative, No CVAT    Assessment:     Vaginal irritation Low abdominal pain Constipation Protein in urine    Plan:     UA C&S sent Rx macrobid 1 bid x 7 days #14 no refills Eat 6 prunes per day and increase water Use condoms

## 2014-09-16 NOTE — Patient Instructions (Signed)
Push fluids Use condoms Urinary Tract Infection Urinary tract infections (UTIs) can develop anywhere along your urinary tract. Your urinary tract is your body's drainage system for removing wastes and extra water. Your urinary tract includes two kidneys, two ureters, a bladder, and a urethra. Your kidneys are a pair of bean-shaped organs. Each kidney is about the size of your fist. They are located below your ribs, one on each side of your spine. CAUSES Infections are caused by microbes, which are microscopic organisms, including fungi, viruses, and bacteria. These organisms are so small that they can only be seen through a microscope. Bacteria are the microbes that most commonly cause UTIs. SYMPTOMS  Symptoms of UTIs may vary by age and gender of the patient and by the location of the infection. Symptoms in young women typically include a frequent and intense urge to urinate and a painful, burning feeling in the bladder or urethra during urination. Older women and men are more likely to be tired, shaky, and weak and have muscle aches and abdominal pain. A fever may mean the infection is in your kidneys. Other symptoms of a kidney infection include pain in your back or sides below the ribs, nausea, and vomiting. DIAGNOSIS To diagnose a UTI, your caregiver will ask you about your symptoms. Your caregiver also will ask to provide a urine sample. The urine sample will be tested for bacteria and white blood cells. White blood cells are made by your body to help fight infection. TREATMENT  Typically, UTIs can be treated with medication. Because most UTIs are caused by a bacterial infection, they usually can be treated with the use of antibiotics. The choice of antibiotic and length of treatment depend on your symptoms and the type of bacteria causing your infection. HOME CARE INSTRUCTIONS  If you were prescribed antibiotics, take them exactly as your caregiver instructs you. Finish the medication even if  you feel better after you have only taken some of the medication.  Drink enough water and fluids to keep your urine clear or pale yellow.  Avoid caffeine, tea, and carbonated beverages. They tend to irritate your bladder.  Empty your bladder often. Avoid holding urine for long periods of time.  Empty your bladder before and after sexual intercourse.  After a bowel movement, women should cleanse from front to back. Use each tissue only once. SEEK MEDICAL CARE IF:   You have back pain.  You develop a fever.  Your symptoms do not begin to resolve within 3 days. SEEK IMMEDIATE MEDICAL CARE IF:   You have severe back pain or lower abdominal pain.  You develop chills.  You have nausea or vomiting.  You have continued burning or discomfort with urination. MAKE SURE YOU:   Understand these instructions.  Will watch your condition.  Will get help right away if you are not doing well or get worse. Document Released: 06/27/2005 Document Revised: 03/18/2012 Document Reviewed: 10/26/2011 [facility] Patient Information 2015 [place]. This information is not intended to replace advice given to you by your health care provider. Make sure you discuss any questions you have with your health care provider.

## 2014-09-17 LAB — URINALYSIS
Bilirubin Urine: NEGATIVE
Glucose, UA: NEGATIVE mg/dL
Hgb urine dipstick: NEGATIVE
KETONES UR: NEGATIVE mg/dL
Leukocytes, UA: NEGATIVE
NITRITE: NEGATIVE
Protein, ur: NEGATIVE mg/dL
SPECIFIC GRAVITY, URINE: 1.029 (ref 1.005–1.030)
UROBILINOGEN UA: 1 mg/dL (ref 0.0–1.0)
pH: 6.5 (ref 5.0–8.0)

## 2014-09-18 LAB — URINE CULTURE

## 2014-12-20 ENCOUNTER — Encounter: Payer: Self-pay | Admitting: Adult Health

## 2014-12-20 ENCOUNTER — Ambulatory Visit (INDEPENDENT_AMBULATORY_CARE_PROVIDER_SITE_OTHER): Payer: Medicaid Other | Admitting: Adult Health

## 2014-12-20 ENCOUNTER — Other Ambulatory Visit (HOSPITAL_COMMUNITY)
Admission: RE | Admit: 2014-12-20 | Discharge: 2014-12-20 | Disposition: A | Discharge status: Home / Self Care | Payer: Medicaid Other | Source: Ambulatory Visit | Attending: Adult Health | Admitting: Adult Health

## 2014-12-20 VITALS — BP 110/68 | HR 80 | Ht 70.25 in | Wt 177.0 lb

## 2014-12-20 DIAGNOSIS — Z3202 Encounter for pregnancy test, result negative: Secondary | ICD-10-CM | POA: Diagnosis not present

## 2014-12-20 DIAGNOSIS — Z1151 Encounter for screening for human papillomavirus (HPV): Secondary | ICD-10-CM | POA: Diagnosis present

## 2014-12-20 DIAGNOSIS — Z0000 Encounter for general adult medical examination without abnormal findings: Secondary | ICD-10-CM | POA: Diagnosis not present

## 2014-12-20 DIAGNOSIS — Z113 Encounter for screening for infections with a predominantly sexual mode of transmission: Secondary | ICD-10-CM | POA: Insufficient documentation

## 2014-12-20 DIAGNOSIS — Z30011 Encounter for initial prescription of contraceptive pills: Secondary | ICD-10-CM

## 2014-12-20 DIAGNOSIS — Z01419 Encounter for gynecological examination (general) (routine) without abnormal findings: Secondary | ICD-10-CM

## 2014-12-20 DIAGNOSIS — Z309 Encounter for contraceptive management, unspecified: Secondary | ICD-10-CM

## 2014-12-20 DIAGNOSIS — Z3041 Encounter for surveillance of contraceptive pills: Secondary | ICD-10-CM | POA: Insufficient documentation

## 2014-12-20 HISTORY — DX: Encounter for contraceptive management, unspecified: Z30.9

## 2014-12-20 LAB — POCT URINE PREGNANCY: PREG TEST UR: NEGATIVE

## 2014-12-20 MED ORDER — NORETHIN-ETH ESTRAD-FE BIPHAS 1 MG-10 MCG / 10 MCG PO TABS: 1.0000 | ORAL_TABLET | Freq: Every day | ORAL | Status: DC

## 2014-12-20 NOTE — Progress Notes (Signed)
Patient ID: Jacqueline [name], female   DOB: [birth year], 34 y.o.   MRN: [id number] History of Present Illness: Jacqueline [name] is a  34 year old black female, married in for well woman gyn exam and pap.She is still breastfeeding and wants to get on the pill.Not having regular periods    Current Medications, Allergies, Past Medical History, Past Surgical History, Family History and Social History were reviewed in [name].     Review of Systems: Patient denies any headaches, hearing loss, fatigue, blurred vision, shortness of breath, chest pain, abdominal pain, problems with bowel movements, urination, or intercourse. No joint pain or mood swings.     Physical Exam:BP 110/68 mmHg  Pulse 80  Ht 5' 10.25" (1.784 m)  Wt 177 lb (80.287 kg)  BMI 25.23 kg/m2UPT negative General:  Well developed, well nourished, no acute distress Skin:  Warm and dry Neck:  Midline trachea, normal thyroid, good ROM, no lymphadenopathy Lungs; Clear to auscultation bilaterally Breast:  No dominant palpable mass, retraction, or nipple discharge Cardiovascular: Regular rate and rhythm Abdomen:  Soft, non tender, no hepatosplenomegaly Pelvic:  External genitalia is normal in appearance, no lesions.  The vagina is normal in appearance, with good color, moisture and rugae. Urethra has no lesions or masses. The cervix is bulbous, pap with HPV and GC/CHL perofrmed.  Uterus is felt to be normal size, shape, and contour.  No adnexal masses or tenderness noted.Bladder is non tender, no masses felt. Extremities/musculoskeletal:  No swelling or varicosities noted, no clubbing or cyanosis Psych:  No mood changes, alert and cooperative,seems happy   Impression: Well woman gyn exam with pap Contraceptive management    Plan: Rx lo loestrin take 1 daily with 11 refills, gave 1 pack to start today lot [id number] A exp 9/16, use condoms Physical in 1 year

## 2014-12-20 NOTE — Patient Instructions (Signed)
Physical in 1 year Start OCs today use condoms

## 2014-12-21 LAB — CYTOLOGY - PAP

## 2015-03-10 ENCOUNTER — Encounter: Payer: Self-pay | Admitting: Adult Health

## 2015-03-10 ENCOUNTER — Ambulatory Visit (INDEPENDENT_AMBULATORY_CARE_PROVIDER_SITE_OTHER): Payer: Medicaid Other | Admitting: Adult Health

## 2015-03-10 VITALS — BP 112/70 | HR 84 | Ht 71.0 in | Wt 178.5 lb

## 2015-03-10 DIAGNOSIS — B009 Herpesviral infection, unspecified: Secondary | ICD-10-CM

## 2015-03-10 HISTORY — DX: Herpesviral infection, unspecified: B00.9

## 2015-03-10 MED ORDER — ACYCLOVIR 400 MG PO TABS: 400.0000 mg | ORAL_TABLET | Freq: Two times a day (BID) | ORAL | Status: DC

## 2015-03-10 NOTE — Patient Instructions (Signed)
Follow up prn Take acyclovir 1 bid

## 2015-03-10 NOTE — Progress Notes (Signed)
Subjective:     Patient ID: Jacqueline [name], female   DOB: October 20, 1980, 34 y.o.   MRN: [id number]  HPI Jacqueline [name] is a 34 year old black female in complaining of herpes outbreak, with pain on vulva.  Review of Systems  Patient denies any headaches, hearing loss, fatigue, blurred vision, shortness of breath, chest pain, abdominal pain, problems with bowel movements, urination, or intercourse. No joint pain or mood swings.Pain on vulva, due to herpes  Reviewed past medical,surgical, social and family history. Reviewed medications and allergies.     Objective:   Physical Exam BP 112/70 mmHg  Pulse 84  Ht 5\' 11"  (1.803 m)  Wt 178 lb 8 oz (80.967 kg)  BMI 24.91 kg/m2   Skin warm and dry.Pelvic: external genitalia is normal in appearance, has  Linear vesicles in creases of right labia, vagina: scant discharge without odor,urethra has no lesions or masses noted, cervix:smooth and bulbous, uterus: normal size, shape and contour, non tender, no masses felt, adnexa: no masses or tenderness noted. Bladder is non tender and no masses felt.   Assessment:     Herpes     Plan:     Rx acyclovir 400 mg #60 take 1 bid with 11 refills Follow up prn

## 2015-04-28 DIAGNOSIS — R1030 Lower abdominal pain, unspecified: Secondary | ICD-10-CM | POA: Diagnosis present

## 2015-04-28 DIAGNOSIS — Z793 Long term (current) use of hormonal contraceptives: Secondary | ICD-10-CM | POA: Diagnosis not present

## 2015-04-28 DIAGNOSIS — M6281 Muscle weakness (generalized): Secondary | ICD-10-CM | POA: Diagnosis not present

## 2015-04-28 DIAGNOSIS — Z8719 Personal history of other diseases of the digestive system: Secondary | ICD-10-CM | POA: Diagnosis not present

## 2015-04-28 DIAGNOSIS — Z7951 Long term (current) use of inhaled steroids: Secondary | ICD-10-CM | POA: Diagnosis not present

## 2015-04-28 DIAGNOSIS — Z720 Tobacco use: Secondary | ICD-10-CM | POA: Diagnosis not present

## 2015-04-28 DIAGNOSIS — Z9049 Acquired absence of other specified parts of digestive tract: Secondary | ICD-10-CM | POA: Diagnosis not present

## 2015-04-28 DIAGNOSIS — Z79899 Other long term (current) drug therapy: Secondary | ICD-10-CM | POA: Diagnosis not present

## 2015-04-28 DIAGNOSIS — Z8619 Personal history of other infectious and parasitic diseases: Secondary | ICD-10-CM | POA: Diagnosis not present

## 2015-04-28 DIAGNOSIS — Z3202 Encounter for pregnancy test, result negative: Secondary | ICD-10-CM | POA: Diagnosis not present

## 2015-04-28 DIAGNOSIS — R109 Unspecified abdominal pain: Secondary | ICD-10-CM | POA: Diagnosis not present

## 2015-04-28 DIAGNOSIS — Z880 Allergy status to penicillin: Secondary | ICD-10-CM | POA: Diagnosis not present

## 2015-04-28 DIAGNOSIS — Z8669 Personal history of other diseases of the nervous system and sense organs: Secondary | ICD-10-CM | POA: Diagnosis not present

## 2015-04-29 ENCOUNTER — Emergency Department (HOSPITAL_COMMUNITY)
Admission: EM | Admit: 2015-04-29 | Discharge: 2015-04-29 | Disposition: A | Discharge status: Home / Self Care | Payer: Medicaid Other | Attending: Emergency Medicine | Admitting: Emergency Medicine

## 2015-04-29 ENCOUNTER — Emergency Department (HOSPITAL_COMMUNITY): Payer: Medicaid Other

## 2015-04-29 DIAGNOSIS — R109 Unspecified abdominal pain: Secondary | ICD-10-CM

## 2015-04-29 LAB — URINE MICROSCOPIC-ADD ON

## 2015-04-29 LAB — COMPREHENSIVE METABOLIC PANEL
ALBUMIN: 4 g/dL (ref 3.5–5.0)
ALK PHOS: 98 U/L (ref 38–126)
ALT: 13 U/L — ABNORMAL LOW (ref 14–54)
AST: 17 U/L (ref 15–41)
Anion gap: 5 (ref 5–15)
BUN: 15 mg/dL (ref 6–20)
CALCIUM: 8.8 mg/dL — AB (ref 8.9–10.3)
CO2: 27 mmol/L (ref 22–32)
Chloride: 106 mmol/L (ref 101–111)
Creatinine, Ser: 0.68 mg/dL (ref 0.44–1.00)
GFR calc Af Amer: >60 mL/min (ref >60)
GFR calc non Af Amer: >60 mL/min (ref >60)
Glucose, Bld: 103 mg/dL — ABNORMAL HIGH (ref 65–99)
Potassium: 3.4 mmol/L — ABNORMAL LOW (ref 3.5–5.1)
SODIUM: 138 mmol/L (ref 135–145)
TOTAL PROTEIN: 7.3 g/dL (ref 6.5–8.1)
Total Bilirubin: 0.2 mg/dL — ABNORMAL LOW (ref 0.3–1.2)

## 2015-04-29 LAB — CBC WITH DIFFERENTIAL/PLATELET
Basophils Absolute: 0 10*3/uL (ref 0.0–0.1)
Basophils Relative: 0 % (ref 0–1)
EOS ABS: 0.4 10*3/uL (ref 0.0–0.7)
Eosinophils Relative: 3 % (ref 0–5)
HCT: 33.6 % — ABNORMAL LOW (ref 36.0–46.0)
Hemoglobin: 11 g/dL — ABNORMAL LOW (ref 12.0–15.0)
LYMPHS ABS: 3.6 10*3/uL (ref 0.7–4.0)
LYMPHS PCT: 30 % (ref 12–46)
MCH: 26.6 pg (ref 26.0–34.0)
MCHC: 32.7 g/dL (ref 30.0–36.0)
MCV: 81.4 fL (ref 78.0–100.0)
MONO ABS: 1.3 10*3/uL — AB (ref 0.1–1.0)
Monocytes Relative: 11 % (ref 3–12)
NEUTROS ABS: 6.8 10*3/uL (ref 1.7–7.7)
NEUTROS PCT: 56 % (ref 43–77)
PLATELETS: 214 10*3/uL (ref 150–400)
RBC: 4.13 MIL/uL (ref 3.87–5.11)
RDW: 14.6 % (ref 11.5–15.5)
WBC: 12.1 10*3/uL — ABNORMAL HIGH (ref 4.0–10.5)

## 2015-04-29 LAB — URINALYSIS, ROUTINE W REFLEX MICROSCOPIC
BILIRUBIN URINE: NEGATIVE
Glucose, UA: NEGATIVE mg/dL
Leukocytes, UA: NEGATIVE
NITRITE: NEGATIVE
PH: 5.5 (ref 5.0–8.0)
PROTEIN: NEGATIVE mg/dL
Specific Gravity, Urine: >1.03 — ABNORMAL HIGH (ref 1.005–1.030)
UROBILINOGEN UA: 0.2 mg/dL (ref 0.0–1.0)

## 2015-04-29 LAB — PREGNANCY, URINE: Preg Test, Ur: NEGATIVE

## 2015-04-29 LAB — LIPASE, BLOOD: LIPASE: 11 U/L — AB (ref 22–51)

## 2015-04-29 MED ORDER — SODIUM CHLORIDE 0.9 % IV BOLUS (SEPSIS)
1000.0000 mL | Freq: Once | INTRAVENOUS | Status: AC
  Administered 2015-04-29: 1000 mL via INTRAVENOUS

## 2015-04-29 MED ORDER — HYDROCODONE-ACETAMINOPHEN 5-325 MG PO TABS: 1.0000 | ORAL_TABLET | Freq: Four times a day (QID) | ORAL | Status: DC | PRN

## 2015-04-29 MED ORDER — IOHEXOL 300 MG/ML  SOLN
25.0000 mL | Freq: Once | INTRAMUSCULAR | Status: AC | PRN
  Administered 2015-04-29: 25 mL via ORAL

## 2015-04-29 MED ORDER — IOHEXOL 300 MG/ML  SOLN
100.0000 mL | Freq: Once | INTRAMUSCULAR | Status: AC | PRN
  Administered 2015-04-29: 100 mL via INTRAVENOUS

## 2015-04-29 NOTE — Discharge Instructions (Signed)
Hydrocodone as prescribed as needed for pain.  Follow-up with your GYN doctor in the next week for a recheck, and return to the ER symptoms significantly worsen or change.   Abdominal Pain, Women Abdominal (stomach, pelvic, or belly) pain can be caused by many things. It is important to tell your doctor:  The location of the pain.  Does it come and go or is it present all the time?  Are there things that start the pain (eating certain foods, exercise)?  Are there other symptoms associated with the pain (fever, nausea, vomiting, diarrhea)? All of this is helpful to know when trying to find the cause of the pain. CAUSES   Stomach: virus or bacteria infection, or ulcer.  Intestine: appendicitis (inflamed appendix), regional ileitis (Crohn's disease), ulcerative colitis (inflamed colon), irritable bowel syndrome, diverticulitis (inflamed diverticulum of the colon), or cancer of the stomach or intestine.  Gallbladder disease or stones in the gallbladder.  Kidney disease, kidney stones, or infection.  Pancreas infection or cancer.  Fibromyalgia (pain disorder).  Diseases of the female organs:  Uterus: fibroid (non-cancerous) tumors or infection.  Fallopian tubes: infection or tubal pregnancy.  Ovary: cysts or tumors.  Pelvic adhesions (scar tissue).  Endometriosis (uterus lining tissue growing in the pelvis and on the pelvic organs).  Pelvic congestion syndrome (female organs filling up with blood just before the menstrual period).  Pain with the menstrual period.  Pain with ovulation (producing an egg).  Pain with an IUD (intrauterine device, birth control) in the uterus.  Cancer of the female organs.  Functional pain (pain not caused by a disease, may improve without treatment).  Psychological pain.  Depression. DIAGNOSIS  Your doctor will decide the seriousness of your pain by doing an examination.  Blood tests.  X-rays.  Ultrasound.  CT scan (computed  tomography, special type of X-ray).  MRI (magnetic resonance imaging).  Cultures, for infection.  Barium enema (dye inserted in the large intestine, to better view it with X-rays).  Colonoscopy (looking in intestine with a lighted tube).  Laparoscopy (minor surgery, looking in abdomen with a lighted tube).  Major abdominal exploratory surgery (looking in abdomen with a large incision). TREATMENT  The treatment will depend on the cause of the pain.   Many cases can be observed and treated at home.  Over-the-counter medicines recommended by your caregiver.  Prescription medicine.  Antibiotics, for infection.  Birth control pills, for painful periods or for ovulation pain.  Hormone treatment, for endometriosis.  Nerve blocking injections.  Physical therapy.  Antidepressants.  Counseling with a psychologist or psychiatrist.  Minor or major surgery. HOME CARE INSTRUCTIONS   Do not take laxatives, unless directed by your caregiver.  Take over-the-counter pain medicine only if ordered by your caregiver. Do not take aspirin because it can cause an upset stomach or bleeding.  Try a clear liquid diet (broth or water) as ordered by your caregiver. Slowly move to a bland diet, as tolerated, if the pain is related to the stomach or intestine.  Have a thermometer and take your temperature several times a day, and record it.  Bed rest and sleep, if it helps the pain.  Avoid sexual intercourse, if it causes pain.  Avoid stressful situations.  Keep your follow-up appointments and tests, as your caregiver orders.  If the pain does not go away with medicine or surgery, you may try:  Acupuncture.  Relaxation exercises (yoga, meditation).  Group therapy.  Counseling. SEEK MEDICAL CARE IF:   You notice  certain foods cause stomach pain.  Your home care treatment is not helping your pain.  You need stronger pain medicine.  You want your IUD removed.  You feel faint  or lightheaded.  You develop nausea and vomiting.  You develop a rash.  You are having side effects or an allergy to your medicine. SEEK IMMEDIATE MEDICAL CARE IF:   Your pain does not go away or gets worse.  You have a fever.  Your pain is felt only in portions of the abdomen. The right side could possibly be appendicitis. The left lower portion of the abdomen could be colitis or diverticulitis.  You are passing blood in your stools (bright red or black tarry stools, with or without vomiting).  You have blood in your urine.  You develop chills, with or without a fever.  You pass out. MAKE SURE YOU:   Understand these instructions.  Will watch your condition.  Will get help right away if you are not doing well or get worse. Document Released: 07/15/2007 Document Revised: 02/01/2014 Document Reviewed: 08/04/2009 [facility] Patient Information 2015 [place], [place]. This information is not intended to replace advice given to you by your health care provider. Make sure you discuss any questions you have with your health care provider.

## 2015-04-29 NOTE — ED Notes (Signed)
Intermittent abd pain and pressure for 2 weeks, also reports weakness in her legs.  Pt denies vomiting or diarrhea

## 2015-04-29 NOTE — ED Provider Notes (Signed)
CSN: [id number]   Arrival date & time 04/28/15 2342  History  This chart was scribed for  Geoffery Lyons, MD by Bethel Born, ED Scribe. This patient was seen in room APA19/[facility]19 and the patient's care was started at 12:52 AM.  Chief Complaint  Patient presents with  . Abdominal Pain    HPI The history is provided by the patient. No language interpreter was used.   Jacqueline [name] is a 34 y.o. female who presents to the Emergency Department complaining of intermittent lower abdominal pain with onset 2 months ago. The pain is described as pressure. Certain foods "like pizza" exacerbate the pain.  Associated symptoms include lower back pain and LE weakness. Normal bowel movements. No difficulty urinating, abnormal vaginal bleeding, or abnormal vaginal discharge. Past surgical history is significant for cholecystectomy and ERCP.   Past Medical History  Diagnosis Date  . Herpes simplex without mention of complication   . 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 Vaginal irritation 09/16/2014  . Constipated 09/16/2014  . Contraceptive management 12/20/2014  . Herpes infection 03/10/2015    Past Surgical History  Procedure Laterality Date  . None    . Endoscopic retrograde cholangiopancreatography (ercp) with propofol  2015  . Ercp N/A 12/29/2013    Procedure: ENDOSCOPIC RETROGRADE CHOLANGIOPANCREATOGRAPHY (ERCP);  Surgeon: Corbin Ade, MD;  Location: [facility];  Service: Endoscopy;  Laterality: N/A;  and stone extraction  . Sphincterotomy N/A 12/29/2013    Procedure: SPHINCTEROTOMY;  Surgeon: Corbin Ade, MD;  Location: [facility];  Service: Endoscopy;  Laterality: N/A;  . Cholecystectomy N/A 12/30/2013    Procedure: LAPAROSCOPIC CHOLECYSTECTOMY;  Surgeon: Dalia Heading, MD;  Location: [facility];  Service: General;  Laterality: N/A;    Family History  Problem Relation Age of Onset  . Colon cancer Neg Hx   . Hypertension Mother   . COPD Maternal Grandmother   . Cancer  Maternal Grandmother     lung  . Diabetes Maternal Grandmother   . Hypertension Sister     History  Substance Use Topics  . Smoking status: Current Every Day Smoker    Types: Cigars    Last Attempt to Quit: 09/30/2012  . Smokeless tobacco: Never Used     Comment: smokes 1 cigar daily  . Alcohol Use: No     Review of Systems 10 Systems reviewed and all are negative for acute change except as noted in the HPI.   Home Medications   Prior to Admission medications   Medication Sig Start Date End Date Taking? Authorizing Provider  acyclovir (ZOVIRAX) 400 MG tablet Take 1 tablet (400 mg total) by mouth 2 (two) times daily. 03/10/15  Yes Adline Potter, NP  cetirizine (ZYRTEC) 10 MG tablet Take 10 mg by mouth as needed.    Yes Historical Provider, MD  cholecalciferol (VITAMIN D) 1000 UNITS tablet Take 1,000 Units by mouth daily.   Yes Historical Provider, MD  ferrous sulfate 325 (65 FE) MG tablet Take 325 mg by mouth daily with breakfast.   Yes Historical Provider, MD  fluticasone (FLONASE) 50 MCG/ACT nasal spray Place 2 sprays into both nostrils daily.   Yes Historical Provider, MD  sertraline (ZOLOFT) 100 MG tablet Take 100 mg by mouth daily.   Yes Historical Provider, MD  Norethindrone-Ethinyl Estradiol-Fe Biphas (LO LOESTRIN FE) 1 MG-10 MCG / 10 MCG tablet Take 1 tablet by mouth daily. 12/20/14   Adline Potter, NP  Allergies  Penicillins  Triage Vitals: BP 122/37 mmHg  Pulse 100  Temp(Src) 99.5 F (37.5 C) (Oral)  Resp 16  Ht 6\' 1"  (1.854 m)  Wt 173 lb (78.472 kg)  BMI 22.83 kg/m2  SpO2 100%  Physical Exam  Constitutional: She is oriented to person, place, and time. She appears well-developed and well-nourished. No distress.  HENT:  Head: Normocephalic and atraumatic.  Eyes: EOM are normal. Pupils are equal, round, and reactive to light.  Neck: Neck supple.  Cardiovascular: Normal rate and regular rhythm.   Pulmonary/Chest: Effort normal and breath sounds  normal. No respiratory distress.  Abdominal: Soft. Bowel sounds are normal. She exhibits no distension. There is no tenderness.  Musculoskeletal: Normal range of motion. She exhibits no edema.  Neurological: She is alert and oriented to person, place, and time. No cranial nerve deficit.  Skin: Skin is warm and dry. No rash noted. She is not diaphoretic.  Psychiatric: She has a normal mood and affect. Her behavior is normal.  Nursing note and vitals reviewed.   ED Course  Procedures   DIAGNOSTIC STUDIES: Oxygen Saturation is 100% on RA, normal by my interpretation.    COORDINATION OF CARE: 12:55 AM Discussed treatment plan which includes lab work with pt at bedside and pt agreed to plan.  Labs Review- Labs Reviewed - No data to display  Imaging Review No results found.  EKG Interpretation None      MDM   Final diagnoses:  None     Workup unremarkable with the exception of the suggestion of pelvic congestion syndrome on CT scan. She will be discharged to home with pain medication and follow-up with her GYN.   I personally performed the services described in this documentation, which was scribed in my presence. The recorded information has been reviewed and is accurate.     Geoffery Lyons, MD 04/29/15 [phone]

## 2015-04-29 NOTE — ED Notes (Signed)
Pt states understanding of care given and follow up instructions 

## 2015-05-09 ENCOUNTER — Encounter: Payer: Self-pay | Admitting: Adult Health

## 2015-05-09 ENCOUNTER — Ambulatory Visit (INDEPENDENT_AMBULATORY_CARE_PROVIDER_SITE_OTHER): Payer: Medicaid Other | Admitting: Adult Health

## 2015-05-09 VITALS — BP 116/60 | HR 64 | Ht 72.0 in | Wt 180.5 lb

## 2015-05-09 DIAGNOSIS — M545 Low back pain, unspecified: Secondary | ICD-10-CM

## 2015-05-09 DIAGNOSIS — R1030 Lower abdominal pain, unspecified: Secondary | ICD-10-CM | POA: Diagnosis not present

## 2015-05-09 DIAGNOSIS — Z3202 Encounter for pregnancy test, result negative: Secondary | ICD-10-CM | POA: Diagnosis not present

## 2015-05-09 DIAGNOSIS — M549 Dorsalgia, unspecified: Secondary | ICD-10-CM | POA: Insufficient documentation

## 2015-05-09 HISTORY — DX: Dorsalgia, unspecified: M54.9

## 2015-05-09 LAB — POCT URINE PREGNANCY: Preg Test, Ur: NEGATIVE

## 2015-05-09 MED ORDER — IBUPROFEN 800 MG PO TABS: 800.0000 mg | ORAL_TABLET | Freq: Three times a day (TID) | ORAL | Status: DC | PRN

## 2015-05-09 NOTE — Progress Notes (Signed)
Subjective:     Patient ID: Jacqueline [name], female   DOB: April 13, 1981, 34 y.o.   MRN: [id number]  HPI Jacqueline [name] is a 34 year old black female, in complaining of pain in low back and abdomen, is worse after sex and legs feel weak at times, she is taking OCs for about 6 weeks now and she is still breastfeeding.She was seen in ER 7/29 and was told had pelvic congestion syndrome.  Review of Systems Patient denies any headaches, hearing loss, fatigue, blurred vision, shortness of breath, chest pain,  problems with bowel movements, urination, or intercourse. No joint pain or mood swings.See HPI for positives. Reviewed past medical,surgical, social and family history. Reviewed medications and allergies.     Objective:   Physical Exam BP 116/60 mmHg  Pulse 64  Ht 6' (1.829 m)  Wt 180 lb 8 oz (81.874 kg)  BMI 24.47 kg/m2UPT negative, Skin warm and dry.Pelvic: external genitalia is normal in appearance no lesions, vagina: brown discharge without odor,urethra has no lesions or masses noted, cervix:smooth and bulbous, uterus: normal size, shape and contour, non tender, no masses felt, adnexa: no masses or tenderness noted. Bladder is non tender and no masses felt.     Assessment:     Low abdominal pain Low back pain    Plan:    Get him to not ejaculate in vagina Increase water Rx motrin 800 mg #60 take 1 every 8 hours prn with 1 refill, for but for a few days take every 8 hours Take warm bath or use heating pad Follow up prn

## 2015-05-09 NOTE — Patient Instructions (Signed)
Take motrin every 8 hours  Increase water  Follow up prn

## 2015-07-01 ENCOUNTER — Other Ambulatory Visit: Payer: Self-pay | Admitting: Adult Health

## 2015-08-02 ENCOUNTER — Encounter: Payer: Self-pay | Admitting: Adult Health

## 2015-08-02 ENCOUNTER — Ambulatory Visit (INDEPENDENT_AMBULATORY_CARE_PROVIDER_SITE_OTHER): Payer: Medicaid Other | Admitting: Adult Health

## 2015-08-02 VITALS — BP 130/60 | HR 66 | Ht 72.0 in | Wt 182.5 lb

## 2015-08-02 DIAGNOSIS — K649 Unspecified hemorrhoids: Secondary | ICD-10-CM | POA: Insufficient documentation

## 2015-08-02 DIAGNOSIS — Z113 Encounter for screening for infections with a predominantly sexual mode of transmission: Secondary | ICD-10-CM

## 2015-08-02 DIAGNOSIS — Z1211 Encounter for screening for malignant neoplasm of colon: Secondary | ICD-10-CM

## 2015-08-02 DIAGNOSIS — K5900 Constipation, unspecified: Secondary | ICD-10-CM | POA: Insufficient documentation

## 2015-08-02 DIAGNOSIS — R1032 Left lower quadrant pain: Secondary | ICD-10-CM | POA: Insufficient documentation

## 2015-08-02 HISTORY — DX: Left lower quadrant pain: R10.32

## 2015-08-02 HISTORY — DX: Unspecified hemorrhoids: K64.9

## 2015-08-02 HISTORY — DX: Constipation, unspecified: K59.00

## 2015-08-02 LAB — HEMOCCULT GUIAC POC 1CARD (OFFICE): Fecal Occult Blood, POC: NEGATIVE

## 2015-08-02 MED ORDER — HYDROCORTISONE ACE-PRAMOXINE 1-1 % RE FOAM: 1.0000 | Freq: Two times a day (BID) | RECTAL | Status: DC

## 2015-08-02 NOTE — Patient Instructions (Signed)
Return in 2 days for [place] Try prunes Use proctofoam 2-3 x day Increase water  Hemorrhoids Hemorrhoids are swollen veins around the rectum or anus. There are two types of hemorrhoids:   Internal hemorrhoids. These occur in the veins just inside the rectum. They may poke through to the outside and become irritated and painful.  External hemorrhoids. These occur in the veins outside the anus and can be felt as a painful swelling or hard lump near the anus. CAUSES  Pregnancy.   Obesity.   Constipation or diarrhea.   Straining to have a bowel movement.   Sitting for long periods on the toilet.  Heavy lifting or other activity that caused you to strain.  Anal intercourse. SYMPTOMS   Pain.   Anal itching or irritation.   Rectal bleeding.   Fecal leakage.   Anal swelling.   One or more lumps around the anus.  DIAGNOSIS  Your caregiver may be able to diagnose hemorrhoids by visual examination. Other examinations or tests that may be performed include:   Examination of the rectal area with a gloved hand (digital rectal exam).   Examination of anal canal using a small tube (scope).   A blood test if you have lost a significant amount of blood.  A test to look inside the colon (sigmoidoscopy or colonoscopy). TREATMENT Most hemorrhoids can be treated at home. However, if symptoms do not seem to be getting better or if you have a lot of rectal bleeding, your caregiver may perform a procedure to help make the hemorrhoids get smaller or remove them completely. Possible treatments include:   Placing a rubber band at the base of the hemorrhoid to cut off the circulation (rubber band ligation).   Injecting a chemical to shrink the hemorrhoid (sclerotherapy).   Using a tool to burn the hemorrhoid (infrared light therapy).   Surgically removing the hemorrhoid (hemorrhoidectomy).   Stapling the hemorrhoid to block blood flow to the tissue (hemorrhoid stapling).   HOME CARE INSTRUCTIONS   Eat foods with fiber, such as whole grains, beans, nuts, fruits, and vegetables. Ask your doctor about taking products with added fiber in them (fibersupplements).  Increase fluid intake. Drink enough water and fluids to keep your urine clear or pale yellow.   Exercise regularly.   Go to the bathroom when you have the urge to have a bowel movement. Do not wait.   Avoid straining to have bowel movements.   Keep the anal area dry and clean. Use wet toilet paper or moist towelettes after a bowel movement.   Medicated creams and suppositories may be used or applied as directed.   Only take over-the-counter or prescription medicines as directed by your caregiver.   Take warm sitz baths for 15-20 minutes, 3-4 times a day to ease pain and discomfort.   Place ice packs on the hemorrhoids if they are tender and swollen. Using ice packs between sitz baths may be helpful.   Put ice in a plastic bag.   Place a towel between your skin and the bag.   Leave the ice on for 15-20 minutes, 3-4 times a day.   Do not use a donut-shaped pillow or sit on the toilet for long periods. This increases blood pooling and pain.  SEEK MEDICAL CARE IF:  You have increasing pain and swelling that is not controlled by treatment or medicine.  You have uncontrolled bleeding.  You have difficulty or you are unable to have a bowel movement.  You have  pain or inflammation outside the area of the hemorrhoids. MAKE SURE YOU:  Understand these instructions.  Will watch your condition.  Will get help right away if you are not doing well or get worse.   This information is not intended to replace advice given to you by your health care provider. Make sure you discuss any questions you have with your health care provider.   Document Released: 09/14/2000 Document Revised: 09/03/2012 Document Reviewed: 07/22/2012 Elsevier Interactive Patient Education 2016 [employer] Constipation, Adult Constipation is when a person has fewer than three bowel movements a week, has difficulty having a bowel movement, or has stools that are dry, hard, or larger than normal. As people grow older, constipation is more common. A low-fiber diet, not taking in enough fluids, and taking certain medicines may make constipation worse.  CAUSES   Certain medicines, such as antidepressants, pain medicine, iron supplements, antacids, and water pills.   Certain diseases, such as diabetes, irritable bowel syndrome (IBS), thyroid disease, or depression.   Not drinking enough water.   Not eating enough fiber-rich foods.   Stress or travel.   Lack of physical activity or exercise.   Ignoring the urge to have a bowel movement.   Using laxatives too much.  SIGNS AND SYMPTOMS   Having fewer than three bowel movements a week.   Straining to have a bowel movement.   Having stools that are hard, dry, or larger than normal.   Feeling full or bloated.   Pain in the lower abdomen.   Not feeling relief after having a bowel movement.  DIAGNOSIS  Your health care provider will take a medical history and perform a physical exam. Further testing may be done for severe constipation. Some tests may include:  A barium enema X-ray to examine your rectum, colon, and, sometimes, your small intestine.   A sigmoidoscopy to examine your lower colon.   A colonoscopy to examine your entire colon. TREATMENT  Treatment will depend on the severity of your constipation and what is causing it. Some dietary treatments include drinking more fluids and eating more fiber-rich foods. Lifestyle treatments may include regular exercise. If these diet and lifestyle recommendations do not help, your health care provider may recommend taking over-the-counter laxative medicines to help you have bowel movements. Prescription medicines may be prescribed if over-the-counter medicines do not work.   HOME CARE INSTRUCTIONS   Eat foods that have a lot of fiber, such as fruits, vegetables, whole grains, and beans.  Limit foods high in fat and processed sugars, such as french fries, hamburgers, cookies, candies, and soda.   A fiber supplement may be added to your diet if you cannot get enough fiber from foods.   Drink enough fluids to keep your urine clear or pale yellow.   Exercise regularly or as directed by your health care provider.   Go to the restroom when you have the urge to go. Do not hold it.   Only take over-the-counter or prescription medicines as directed by your health care provider. Do not take other medicines for constipation without talking to your health care provider first.  SEEK IMMEDIATE MEDICAL CARE IF:   You have bright red blood in your stool.   Your constipation lasts for more than 4 days or gets worse.   You have abdominal or rectal pain.   You have thin, pencil-like stools.   You have unexplained weight loss. MAKE SURE YOU:   Understand these instructions.  Will watch your  condition.  Will get help right away if you are not doing well or get worse.   This information is not intended to replace advice given to you by your health care provider. Make sure you discuss any questions you have with your health care provider.   Document Released: 06/15/2004 Document Revised: 10/08/2014 Document Reviewed: 06/29/2013 Elsevier Interactive Patient Education 2016 Elsevier Inc. Pelvic Pain, Female Female pelvic pain can be caused by many different things and start from a variety of places. Pelvic pain refers to pain that is located in the lower half of the abdomen and between your hips. The pain may occur over a short period of time (acute) or may be reoccurring (chronic). The cause of pelvic pain may be related to disorders affecting the female reproductive organs (gynecologic), but it may also be related to the bladder, kidney stones, an intestinal  complication, or muscle or skeletal problems. Getting help right away for pelvic pain is important, especially if there has been severe, sharp, or a sudden onset of unusual pain. It is also important to get help right away because some types of pelvic pain can be life threatening.  CAUSES  Below are only some of the causes of pelvic pain. The causes of pelvic pain can be in one of several categories.   Gynecologic.  Pelvic inflammatory disease.  Sexually transmitted infection.  Ovarian cyst or a twisted ovarian ligament (ovarian torsion).  Uterine lining that grows outside the uterus (endometriosis).  Fibroids, cysts, or tumors.  Ovulation.  Pregnancy.  Pregnancy that occurs outside the uterus (ectopic pregnancy).  Miscarriage.  Labor.  Abruption of the placenta or ruptured uterus.  Infection.  Uterine infection (endometritis).  Bladder infection.  Diverticulitis.  Miscarriage related to a uterine infection (septic abortion).  Bladder.  Inflammation of the bladder (cystitis).  Kidney stone(s).  Gastrointestinal.  Constipation.  Diverticulitis.  Neurologic.  Trauma.  Feeling pelvic pain because of mental or emotional causes (psychosomatic).  Cancers of the bowel or pelvis. EVALUATION  Your caregiver will want to take a careful history of your concerns. This includes recent changes in your health, a careful gynecologic history of your periods (menses), and a sexual history. Obtaining your family history and medical history is also important. Your caregiver may suggest a pelvic exam. A pelvic exam will help identify the location and severity of the pain. It also helps in the evaluation of which organ system may be involved. In order to identify the cause of the pelvic pain and be properly treated, your caregiver may order tests. These tests may include:   A pregnancy test.  Pelvic ultrasonography.  An X-ray exam of the abdomen.  A urinalysis or  evaluation of vaginal discharge.  Blood tests. HOME CARE INSTRUCTIONS   Only take over-the-counter or prescription medicines for pain, discomfort, or fever as directed by your caregiver.   Rest as directed by your caregiver.   Eat a balanced diet.   Drink enough fluids to make your urine clear or pale yellow, or as directed.   Avoid sexual intercourse if it causes pain.   Apply warm or cold compresses to the lower abdomen depending on which one helps the pain.   Avoid stressful situations.   Keep a journal of your pelvic pain. Write down when it started, where the pain is located, and if there are things that seem to be associated with the pain, such as food or your menstrual cycle.  Follow up with your caregiver as directed.  SEEK  MEDICAL CARE IF:  Your medicine does not help your pain.  You have abnormal vaginal discharge. SEEK IMMEDIATE MEDICAL CARE IF:   You have heavy bleeding from the vagina.   Your pelvic pain increases.   You feel light-headed or faint.   You have chills.   You have pain with urination or blood in your urine.   You have uncontrolled diarrhea or vomiting.   You have a fever or persistent symptoms for more than 3 days.  You have a fever and your symptoms suddenly get worse.   You are being physically or sexually abused.   This information is not intended to replace advice given to you by your health care provider. Make sure you discuss any questions you have with your health care provider.   Document Released: 08/14/2004 Document Revised: 06/08/2015 Document Reviewed: 01/07/2012 Elsevier Interactive Patient Education [employer]

## 2015-08-02 NOTE — Progress Notes (Signed)
Subjective:     Patient ID: Jacqueline [name], female   DOB: 11-03-1980, 34 y.o.   MRN: [id number]  HPI Jacqueline [name] is a 34 year old black female in complaining of LLQ pain and trouble with BMs,take OTC stool softener and even uses enema at times, and has to use finger to pull stool out.She wants STD testing is in break up,and finishing up grad school. She is taking lo loestrin and has had recent herpes out break.   Review of Systems Patient denies any headaches, hearing loss, fatigue, blurred vision, shortness of breath, chest pain, problems with  urination, or intercourse. No joint pain or mood swings.See HPI for positives. Reviewed past medical,surgical, social and family history. Reviewed medications and allergies.     Objective:   Physical Exam BP 130/60 mmHg  Pulse 66  Ht 6' (1.829 m)  Wt 182 lb 8 oz (82.781 kg)  BMI 24.75 kg/m2   Skin warm and dry.Pelvic: external genitalia is normal in appearance no lesions, vagina: scant discharge without odor,urethra has no lesions or masses noted, cervix:smooth and bulbous, uterus: normal size, shape and contour, non tender, no masses felt, adnexa: no masses,LLQ tenderness noted. Bladder is non tender and no masses felt. On rectal exam has external and internal hemorrhoids,hemmocult was negative,has mild low rectocele.  Assessment:     LLQ pain Constipation  Hemorrhoids  STD screening    Plan:     GC/CHL sent on urine Return in 2 days for gyn US  Rx proctofoam use bid prn with 3 refills Increase water,try prunes Review handout on pelvic pain,constipation and hemorrhoids

## 2015-08-03 LAB — GC/CHLAMYDIA PROBE AMP
Chlamydia trachomatis, NAA: NEGATIVE
Neisseria gonorrhoeae by PCR: NEGATIVE

## 2015-08-04 ENCOUNTER — Telehealth: Payer: Self-pay | Admitting: Adult Health

## 2015-08-04 ENCOUNTER — Ambulatory Visit (INDEPENDENT_AMBULATORY_CARE_PROVIDER_SITE_OTHER): Payer: Medicaid Other

## 2015-08-04 DIAGNOSIS — R1032 Left lower quadrant pain: Secondary | ICD-10-CM | POA: Diagnosis not present

## 2015-08-04 NOTE — Progress Notes (Signed)
PELVIS TA/TV: normal anteverted uterus,normal ov's bilat (mobile),EEC 406mm,no free fluid,no pain during ultrasound

## 2015-08-04 NOTE — Telephone Encounter (Signed)
Pt aware US was normal,pain probably bowel related,try the prunes and see if better if ha BM more regular

## 2015-09-06 ENCOUNTER — Encounter: Payer: Self-pay | Admitting: Adult Health

## 2015-09-06 ENCOUNTER — Ambulatory Visit (INDEPENDENT_AMBULATORY_CARE_PROVIDER_SITE_OTHER): Payer: Medicaid Other | Admitting: Adult Health

## 2015-09-06 VITALS — BP 120/60 | HR 76 | Ht 72.0 in | Wt 179.0 lb

## 2015-09-06 DIAGNOSIS — R1032 Left lower quadrant pain: Secondary | ICD-10-CM

## 2015-09-06 DIAGNOSIS — N9489 Other specified conditions associated with female genital organs and menstrual cycle: Secondary | ICD-10-CM

## 2015-09-06 HISTORY — DX: Other specified conditions associated with female genital organs and menstrual cycle: N94.89

## 2015-09-06 MED ORDER — KETOROLAC TROMETHAMINE 10 MG PO TABS: 10.0000 mg | ORAL_TABLET | Freq: Four times a day (QID) | ORAL | Status: DC | PRN

## 2015-09-06 NOTE — Patient Instructions (Addendum)
Take toradol 10 mg 1 every 6 hours prn pain Follow up with Dr Despina HiddenEure in 1 week

## 2015-09-06 NOTE — Progress Notes (Signed)
Subjective:     Patient ID: Jacqueline SpiesShikea L Ognibene, female   DOB: 1980-12-03, 34 y.o.   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 and [facility] Had [facility] and brought report with her today.   Review of Systems Patient denies any headaches, hearing loss, shortness of breath, chest pain, problems with bowel movements, urination, or intercourse. No joint pain or mood swings.See HPI for positives.  Reviewed past medical,surgical, social and family history. Reviewed medications and allergies.     Objective:   Physical Exam BP 120/60 mmHg  Pulse 76  Ht 6' (1.829 m)  Wt 179 lb (81.194 kg)  BMI 24.27 kg/m2Skin warm and dry, abdomen soft, has some tenderness LLQ today, reviewed CT, pt has appt with Dr Lovette Clicheynthia Shortell  [facility], [facility]. Face time 15 minutes with 50% discussing symptoms, will try toradol vs narcotic for pain, and get to come back to see Dr Despina HiddenEure.     Assessment:     LLQ pain Pelvic congestion syndrome    Plan:     Rx toradol 10 mg #20 take 1 every 6 hours prn pain with 1 refill Follow up with Dr Despina HiddenEure in 1 week

## 2015-09-07 ENCOUNTER — Telehealth: Payer: Self-pay | Admitting: Adult Health

## 2015-09-07 NOTE — Telephone Encounter (Signed)
No voice mail box.

## 2015-09-13 ENCOUNTER — Ambulatory Visit (INDEPENDENT_AMBULATORY_CARE_PROVIDER_SITE_OTHER): Payer: Medicaid Other | Admitting: Adult Health

## 2015-09-13 ENCOUNTER — Encounter: Payer: Self-pay | Admitting: Adult Health

## 2015-09-13 VITALS — BP 130/80 | HR 78 | Ht 72.0 in | Wt 176.5 lb

## 2015-09-13 DIAGNOSIS — N9489 Other specified conditions associated with female genital organs and menstrual cycle: Secondary | ICD-10-CM | POA: Diagnosis not present

## 2015-09-13 DIAGNOSIS — R1032 Left lower quadrant pain: Secondary | ICD-10-CM

## 2015-09-13 NOTE — Patient Instructions (Signed)
Continue toradol prn Follow up in 4 months for physical But call i needs anything

## 2015-09-13 NOTE — Progress Notes (Signed)
Subjective:     Patient ID: Jacqueline [name], female   DOB: September 23, 1981, 34 y.o.   MRN: [id number]  HPI Jacqueline [name] is a 34 year old black female back in follow up of trying toradol for pain LLQ and legs still feel numb at times.She is smiling today.  Review of Systems Patient denies any headaches, hearing loss, fatigue, blurred vision, shortness of breath, chest pain,  problems with bowel movements, urination, or intercourse. No joint pain or mood swings.See HPI for positives. Reviewed past medical,surgical, social and family history. Reviewed medications and allergies.     Objective:   Physical Exam BP 130/80 mmHg  Pulse 78  Ht 6' (1.829 m)  Wt 176 lb 8 oz (80.06 kg)  BMI 23.93 kg/m2   Face time 10 minutes talking about continuing toradol since seems to help and [facility] and call them often for sooner appt. And she needs voice mail for messages.Discussed with Dr Despina HiddenEure and he says she needs to [facility].  Assessment:    LLQ pain ?pelvic congestion, due to Nut Cracker Syndrome     Plan:     Continue toradol prn Follow up in 4 months for physical or sooner if needed [facility] but call often to see if has cancellation

## 2015-09-16 ENCOUNTER — Encounter: Payer: Self-pay | Admitting: Adult Health

## 2015-10-27 ENCOUNTER — Encounter: Payer: Self-pay | Admitting: Adult Health

## 2015-10-27 ENCOUNTER — Ambulatory Visit (INDEPENDENT_AMBULATORY_CARE_PROVIDER_SITE_OTHER): Payer: Medicaid Other | Admitting: Adult Health

## 2015-10-27 VITALS — BP 120/60 | HR 70 | Ht 72.0 in | Wt 171.0 lb

## 2015-10-27 DIAGNOSIS — N898 Other specified noninflammatory disorders of vagina: Secondary | ICD-10-CM | POA: Diagnosis not present

## 2015-10-27 DIAGNOSIS — N760 Acute vaginitis: Secondary | ICD-10-CM

## 2015-10-27 DIAGNOSIS — A499 Bacterial infection, unspecified: Secondary | ICD-10-CM | POA: Diagnosis not present

## 2015-10-27 DIAGNOSIS — A630 Anogenital (venereal) warts: Secondary | ICD-10-CM | POA: Diagnosis not present

## 2015-10-27 DIAGNOSIS — N9489 Other specified conditions associated with female genital organs and menstrual cycle: Secondary | ICD-10-CM | POA: Diagnosis not present

## 2015-10-27 DIAGNOSIS — B9689 Other specified bacterial agents as the cause of diseases classified elsewhere: Secondary | ICD-10-CM

## 2015-10-27 DIAGNOSIS — K649 Unspecified hemorrhoids: Secondary | ICD-10-CM | POA: Diagnosis not present

## 2015-10-27 HISTORY — DX: Other specified bacterial agents as the cause of diseases classified elsewhere: B96.89

## 2015-10-27 HISTORY — DX: Anogenital (venereal) warts: A63.0

## 2015-10-27 HISTORY — DX: Other specified noninflammatory disorders of vagina: N89.8

## 2015-10-27 LAB — POCT WET PREP (WET MOUNT)
Clue Cells Wet Prep Whiff POC: POSITIVE
WBC WET PREP: POSITIVE

## 2015-10-27 MED ORDER — METRONIDAZOLE 0.75 % VA GEL: VAGINAL | Status: DC

## 2015-10-27 MED ORDER — HYDROCORTISONE ACE-PRAMOXINE 2.5-1 % RE CREA: 1.0000 "application " | TOPICAL_CREAM | Freq: Three times a day (TID) | RECTAL | Status: DC

## 2015-10-27 NOTE — Patient Instructions (Signed)
Use metrogel after feeding Use analpram 2-3 x daily to hemorrhoid  Follow up prn  Bacterial Vaginosis Bacterial vaginosis is a vaginal infection that occurs when the normal balance of bacteria in the vagina is disrupted. It results from an overgrowth of certain bacteria. This is the most common vaginal infection in women of childbearing age. Treatment is important to prevent complications, especially in pregnant women, as it can cause a premature delivery. CAUSES  Bacterial vaginosis is caused by an increase in harmful bacteria that are normally present in smaller amounts in the vagina. Several different kinds of bacteria can cause bacterial vaginosis. However, the reason that the condition develops is not fully understood. RISK FACTORS Certain activities or behaviors can put you at an increased risk of developing bacterial vaginosis, including:  Having a new sex partner or multiple sex partners.  Douching.  Using an intrauterine device (IUD) for contraception. Women do not get bacterial vaginosis from toilet seats, bedding, swimming pools, or contact with objects around them. SIGNS AND SYMPTOMS  Some women with bacterial vaginosis have no signs or symptoms. Common symptoms include:  Grey vaginal discharge.  A fishlike odor with discharge, especially after sexual intercourse.  Itching or burning of the vagina and vulva.  Burning or pain with urination. DIAGNOSIS  Your health care provider will take a medical history and examine the vagina for signs of bacterial vaginosis. A sample of vaginal fluid may be taken. Your health care provider will look at this sample under a microscope to check for bacteria and abnormal cells. A vaginal pH test may also be done.  TREATMENT  Bacterial vaginosis may be treated with antibiotic medicines. These may be given in the form of a pill or a vaginal cream. A second round of antibiotics may be prescribed if the condition comes back after treatment. Because  bacterial vaginosis increases your risk for sexually transmitted diseases, getting treated can help reduce your risk for chlamydia, gonorrhea, HIV, and herpes. HOME CARE INSTRUCTIONS   Only take over-the-counter or prescription medicines as directed by your health care provider.  If antibiotic medicine was prescribed, take it as directed. Make sure you finish it even if you start to feel better.  Tell all sexual partners that you have a vaginal infection. They should see their health care provider and be treated if they have problems, such as a mild rash or itching.  During treatment, it is important that you follow these instructions:  Avoid sexual activity or use condoms correctly.  Do not douche.  Avoid alcohol as directed by your health care provider.  Avoid breastfeeding as directed by your health care provider. SEEK MEDICAL CARE IF:   Your symptoms are not improving after 3 days of treatment.  You have increased discharge or pain.  You have a fever. MAKE SURE YOU:   Understand these instructions.  Will watch your condition.  Will get help right away if you are not doing well or get worse. FOR MORE INFORMATION  Centers for Disease Control and Prevention, Division of STD Prevention: [employer] American Sexual Health Association (ASHA): www.ashastd.org    This information is not intended to replace advice given to you by your health care provider. Make sure you discuss any questions you have with your health care provider.   Document Released: 09/17/2005 Document Revised: 10/08/2014 Document Reviewed: 04/29/2013 Elsevier Interactive Patient Education [employer]

## 2015-10-27 NOTE — Progress Notes (Signed)
Subjective:     Patient ID: Jacqueline [name], female   DOB: 1981-09-18, 35 y.o.   MRN: [id number]  HPI Marieanne is a 35 year old black female in complaining of vaginal discharge and odor with some itching.Hemorrhoid still itching some too.   Review of Systems Patient denies any headaches, hearing loss, fatigue, blurred vision, shortness of breath, chest pain, abdominal pain, problems with bowel movements, urination, or intercourse. No joint pain or mood swings.See HPI for positives. Reviewed past medical,surgical, social and family history. Reviewed medications and allergies.     Objective:   Physical Exam BP 120/60 mmHg  Pulse 70  Ht 6' (1.829 m)  Wt 171 lb (77.565 kg)  BMI 23.19 kg/m2 Skin warm and dry.Pelvic: external genitalia is normal in appearance no lesions, vagina: white discharge with odor,urethra has no lesions or masses noted, cervix:smooth and bulbous, uterus: normal size, shape and contour, non tender, no masses felt, adnexa: no masses or tenderness noted. Bladder is non tender and no masses felt. Wet prep: + for clue cells and +WBCs. Has +hemorrhoid and ?wart at anal opening. She has seen Dr Nigel Sloop at [facility] for ? Pelvic congestion syndrome and Nutcrackers syndrome.   If has to have surgery may can have hemorrhoids removed then.But has to have further testing, she has alert necklace now as she has fallen at home some, when legs give way.   Assessment:     Vaginal discharge Vaginal odor BV Anal wart Hemorrhoid     Plan:    Review handout on BV Try analpram HC 2.5%, 3 small tubes given to try, if works call for Rx Rx metrogel use 1 applicator in vagina at HS for 5 nights, feed before using Follow up  prn

## 2015-11-10 ENCOUNTER — Telehealth: Payer: Self-pay | Admitting: Adult Health

## 2015-11-10 NOTE — Telephone Encounter (Signed)
Left message I Called  

## 2015-11-16 ENCOUNTER — Telehealth: Payer: Self-pay | Admitting: Adult Health

## 2015-11-16 MED ORDER — METRONIDAZOLE 500 MG PO TABS: 500.0000 mg | ORAL_TABLET | Freq: Three times a day (TID) | ORAL | Status: DC

## 2015-11-16 NOTE — Telephone Encounter (Signed)
Complains of BV still will rx flagyl, has recently been moved out of apartment for mold.

## 2015-11-16 NOTE — Telephone Encounter (Signed)
Pt called stating that she would like for Victorino Dike to give her a call back, Pt did not state the reason why. Please contact pt

## 2016-04-05 ENCOUNTER — Encounter: Payer: Self-pay | Admitting: Adult Health

## 2016-04-05 ENCOUNTER — Ambulatory Visit (INDEPENDENT_AMBULATORY_CARE_PROVIDER_SITE_OTHER): Payer: Medicaid Other | Admitting: Adult Health

## 2016-04-05 VITALS — BP 120/64 | HR 66 | Ht 70.5 in | Wt 199.0 lb

## 2016-04-05 DIAGNOSIS — Z308 Encounter for other contraceptive management: Secondary | ICD-10-CM

## 2016-04-05 DIAGNOSIS — Z01419 Encounter for gynecological examination (general) (routine) without abnormal findings: Secondary | ICD-10-CM

## 2016-04-05 DIAGNOSIS — Z3009 Encounter for other general counseling and advice on contraception: Secondary | ICD-10-CM

## 2016-04-05 DIAGNOSIS — Z30018 Encounter for initial prescription of other contraceptives: Secondary | ICD-10-CM

## 2016-04-05 MED ORDER — ETONOGESTREL-ETHINYL ESTRADIOL 0.12-0.015 MG/24HR VA RING: VAGINAL_RING | VAGINAL | Status: DC

## 2016-04-05 NOTE — Patient Instructions (Signed)
Physical in 1 year, pap 2019 Put in nuva ring with next period

## 2016-04-05 NOTE — Progress Notes (Signed)
Patient ID: Jacqueline [name], female   DOB: 1981/02/17, 35 y.o.   MRN: [id number] History of Present Illness: Jacqueline [name] is a 35 year old black female in for well woman gyn exam, she had a normal pap with negative HPV 12/20/14 and has family planning medicaid.   Current Medications, Allergies, Past Medical History, Past Surgical History, Family History and Social History were reviewed in [name].     Review of Systems: Patient denies any headaches, hearing loss, fatigue, blurred vision, shortness of breath, chest pain, abdominal pain, problems with bowel movements, urination, or intercourse. No joint pain or mood swings.She is still breast feeding at night a 552 1/35 year old.She is feeling much better and has not had to have surgery at [facility].Her apartment had black mole and 4 other kinds of mole and since she has moved out she has been fine.She says she needs birth control.    Physical Exam:BP 120/64 mmHg  Pulse 66  Ht 5' 10.5" (1.791 m)  Wt 199 lb (90.266 kg)  BMI 28.14 kg/m2  LMP 03/22/2016 General:  Well developed, well nourished, no acute distress Skin:  Warm and dry Neck:  Midline trachea, normal thyroid, good ROM, no lymphadenopathy Lungs; Clear to auscultation bilaterally Breast:  No dominant palpable mass, retraction, or nipple discharge Cardiovascular: Regular rate and rhythm Abdomen:  Soft, non tender, no hepatosplenomegaly Pelvic:  External genitalia is normal in appearance, no lesions.  The vagina is normal in appearance. Urethra has no lesions or masses. The cervix is bulbous.GC/CHL obtained.  Uterus is felt to be normal size, shape, and contour.  No adnexal masses or tenderness noted.Bladder is non tender, no masses felt. Extremities/musculoskeletal:  No swelling or varicosities noted, no clubbing or cyanosis Psych:  No mood changes, alert and cooperative,seems happy   Impression:  Well woman gyn exam, no pap Contraceptive management Family  planning    Plan: GC/CHL sent  Rx nuva ring disp 1 ring with 12 refills, insert with next period Use condoms Check HIV,RPR and HSV 2 Physical in 1 year, pap in 2019 Review handouts on nexplanon and tubal sterilization

## 2016-04-06 LAB — HIV ANTIBODY (ROUTINE TESTING W REFLEX): HIV Screen 4th Generation wRfx: NONREACTIVE

## 2016-04-06 LAB — RPR: RPR: NONREACTIVE

## 2016-04-06 LAB — HSV 2 ANTIBODY, IGG: HSV 2 Glycoprotein G Ab, IgG: 12.7 index — ABNORMAL HIGH (ref 0.00–0.90)

## 2016-04-07 LAB — GC/CHLAMYDIA PROBE AMP
Chlamydia trachomatis, NAA: NEGATIVE
Neisseria gonorrhoeae by PCR: NEGATIVE

## 2016-04-09 ENCOUNTER — Telehealth: Payer: Self-pay | Admitting: Adult Health

## 2016-04-09 NOTE — Telephone Encounter (Signed)
Pt aware of labs  

## 2016-10-31 ENCOUNTER — Ambulatory Visit (INDEPENDENT_AMBULATORY_CARE_PROVIDER_SITE_OTHER): Payer: Medicaid Other | Admitting: Adult Health

## 2016-10-31 ENCOUNTER — Encounter: Payer: Self-pay | Admitting: Adult Health

## 2016-10-31 VITALS — BP 124/80 | HR 68 | Ht 72.0 in | Wt 212.5 lb

## 2016-10-31 DIAGNOSIS — Z30011 Encounter for initial prescription of contraceptive pills: Secondary | ICD-10-CM

## 2016-10-31 DIAGNOSIS — A749 Chlamydial infection, unspecified: Secondary | ICD-10-CM

## 2016-10-31 DIAGNOSIS — Z3009 Encounter for other general counseling and advice on contraception: Secondary | ICD-10-CM

## 2016-10-31 DIAGNOSIS — Z113 Encounter for screening for infections with a predominantly sexual mode of transmission: Secondary | ICD-10-CM

## 2016-10-31 DIAGNOSIS — N898 Other specified noninflammatory disorders of vagina: Secondary | ICD-10-CM

## 2016-10-31 DIAGNOSIS — B9689 Other specified bacterial agents as the cause of diseases classified elsewhere: Secondary | ICD-10-CM

## 2016-10-31 DIAGNOSIS — N760 Acute vaginitis: Secondary | ICD-10-CM

## 2016-10-31 LAB — POCT WET PREP (WET MOUNT)
CLUE CELLS WET PREP WHIFF POC: POSITIVE
WBC WET PREP: POSITIVE

## 2016-10-31 MED ORDER — NORETHIN-ETH ESTRAD-FE BIPHAS 1 MG-10 MCG / 10 MCG PO TABS: 1.0000 | ORAL_TABLET | Freq: Every day | ORAL | 11 refills | Status: DC

## 2016-10-31 MED ORDER — METRONIDAZOLE 500 MG PO TABS: 500.0000 mg | ORAL_TABLET | Freq: Three times a day (TID) | ORAL | 0 refills | Status: DC

## 2016-10-31 NOTE — Patient Instructions (Signed)
Use condoms  

## 2016-10-31 NOTE — Progress Notes (Signed)
Subjective:     Patient ID: Jacqueline [name], female   DOB: [birth year], 36 y.o.   MRN: [id number] [id number]  HPI Jacqueline [name] is a 36 year old black female in complaining of vaginal discharge with irritation and odor and wants STD testing and to change birth control, from nuva ring to the pill.  Review of Systems +vaginal discharge with irritation and odor Reviewed past medical,surgical, social and family history. Reviewed medications and allergies.     Objective:   Physical Exam BP 124/80 (BP Location: Left Arm, Patient Position: Sitting, Cuff Size: Large)   Pulse 68   Ht 6' (1.829 m)   Wt 212 lb 8 oz (96.4 kg)   LMP 09/19/2016   BMI 28.82 kg/m   PHQ 2 score 0.  Skin warm and dry.Pelvic: external genitalia is normal in appearance no lesions, vagina: greenish discharge with odor,urethra has no lesions or masses noted, cervix:smooth and bulbous, uterus: normal size, shape and contour, non tender, no masses felt, adnexa: no masses or tenderness noted. Bladder is non tender and no masses felt. Wet prep: + for clue cells and +WBCs. GC/CHL obtained.    Will rx lo loestrin and gave handout on nexplanon to review.  Assessment:     1. Vaginal discharge   2. BV (bacterial vaginosis)   3. Vaginal odor   4. Screening examination for STD (sexually transmitted disease)   5. Encounter for initial prescription of contraceptive pills       Plan:     Meds ordered this encounter  Medications  . Multiple Vitamins-Calcium (ONE-A-DAY WOMENS PO)    Sig: Take by mouth daily.  . metroNIDAZOLE (FLAGYL) 500 MG tablet    Sig: Take 1 tablet (500 mg total) by mouth 3 (three) times daily.    Dispense:  21 tablet    Refill:  0    Order Specific Question:   Supervising Provider    Answer:   Despina HiddenEURE, LUTHER H [[id number]]  . Norethindrone-Ethinyl Estradiol-Fe Biphas (LO LOESTRIN FE) 1 MG-10 MCG / 10 MCG tablet    Sig: Take 1 tablet by mouth daily. Take 1 daily by mouth    Dispense:  1 Package    Refill:  11    BIN [id number],  PCN CN, GRP [id number],ID [id number]    Order Specific Question:   Supervising Provider    Answer:   Lazaro ArmsEURE, LUTHER H [[id number]]  GC/CHL sent Use condoms Follow up prn   Review handout on nexplanon

## 2016-11-03 LAB — GC/CHLAMYDIA PROBE AMP
Chlamydia trachomatis, NAA: POSITIVE — AB
NEISSERIA GONORRHOEAE BY PCR: NEGATIVE

## 2016-11-05 ENCOUNTER — Encounter: Payer: Self-pay | Admitting: Adult Health

## 2016-11-05 ENCOUNTER — Telehealth: Payer: Self-pay | Admitting: Adult Health

## 2016-11-05 DIAGNOSIS — A749 Chlamydial infection, unspecified: Secondary | ICD-10-CM

## 2016-11-05 HISTORY — DX: Chlamydial infection, unspecified: A74.9

## 2016-11-05 MED ORDER — AZITHROMYCIN 500 MG PO TABS: ORAL_TABLET | ORAL | 0 refills | Status: DC

## 2016-11-05 NOTE — Telephone Encounter (Signed)
Pt aware +chlamydia , will treat pt and partner, Jacqueline [name] [birth year]b [birth year] with azithromycin 500 mg 2 po, now, no sex, POC 12/03/16 and [facility] sent

## 2016-11-16 IMAGING — CT CT ABD-PELV W/ CM
2 of 4 series · 16 of 46 positions shown, 18 images · IV contrast (Omnipaque 300)
Comparison: CT 09/15/2014

CLINICAL DATA: Intermittent lower abdominal pain for 2 months.

EXAM:
CT ABDOMEN AND PELVIS WITH CONTRAST
TECHNIQUE: Multidetector CT imaging of the abdomen and pelvis was performed
using the standard protocol following bolus administration of
intravenous contrast.
CONTRAST:  100mL OMNIPAQUE IOHEXOL 300 MG/ML SOLN, 25mL OMNIPAQUE
IOHEXOL 300 MG/ML SOLN

[Series 2: abd_pel_with 5.0 b40f · axial · 0.69mm/px · z∈[-470,-56]mm · 13 of 91 slices shown, 15 images]
[im 4/91  soft-tissue]
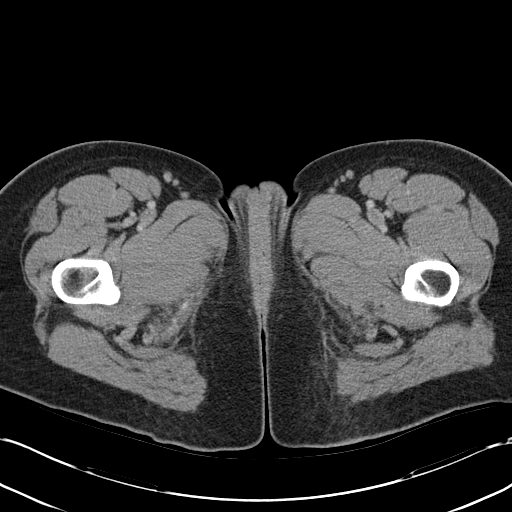
[im 4/91  bone]
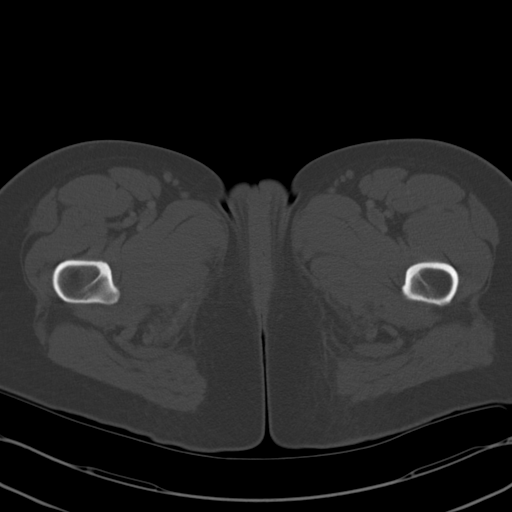
[im 11/91  soft-tissue]
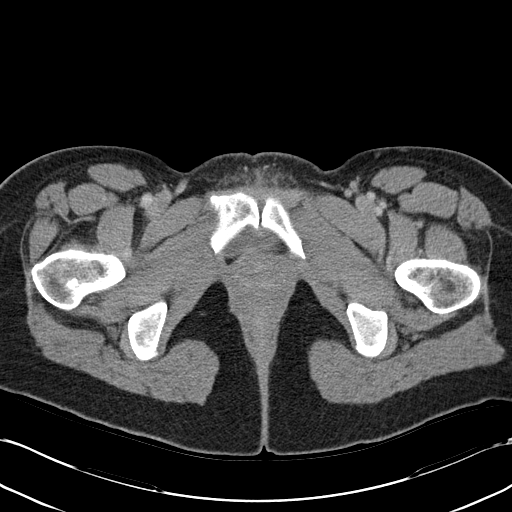
[im 19/91  soft-tissue]
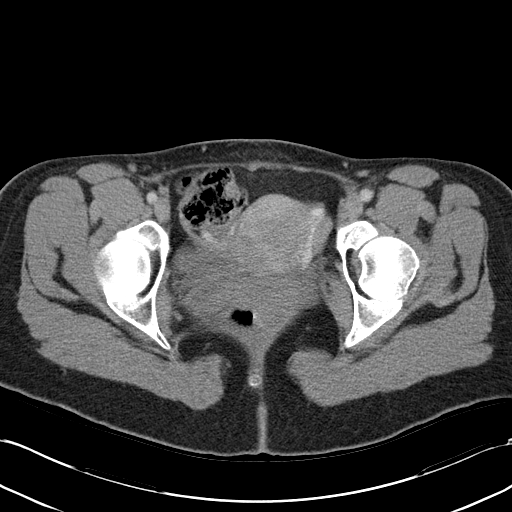
[im 26/91  soft-tissue]
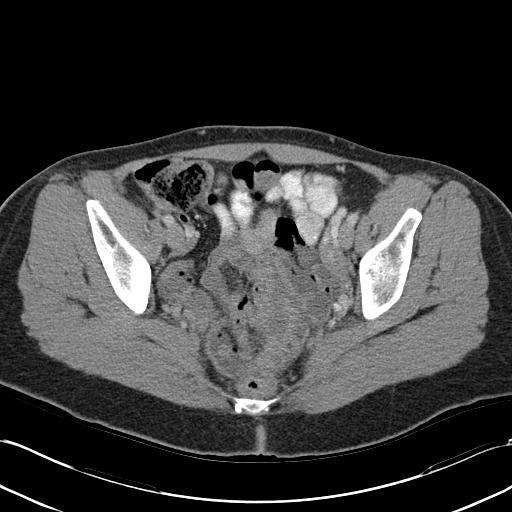
[im 33/91  soft-tissue]
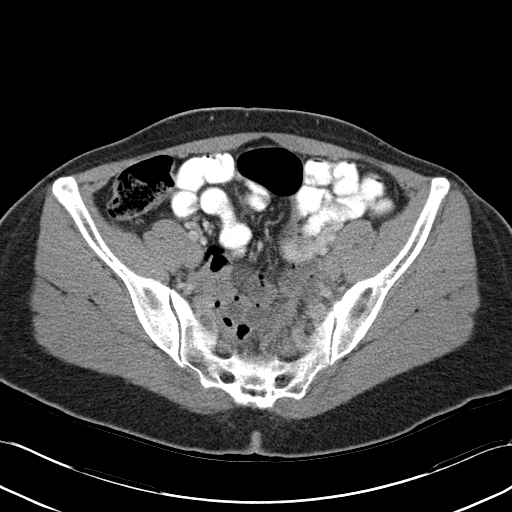
[im 40/91  soft-tissue]
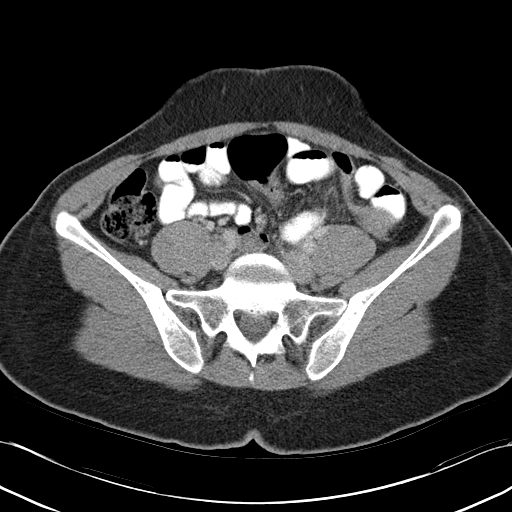
[im 47/91  soft-tissue]
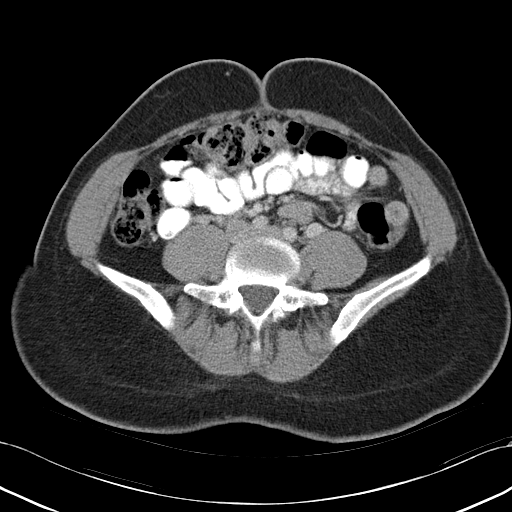
[im 51/91  soft-tissue]
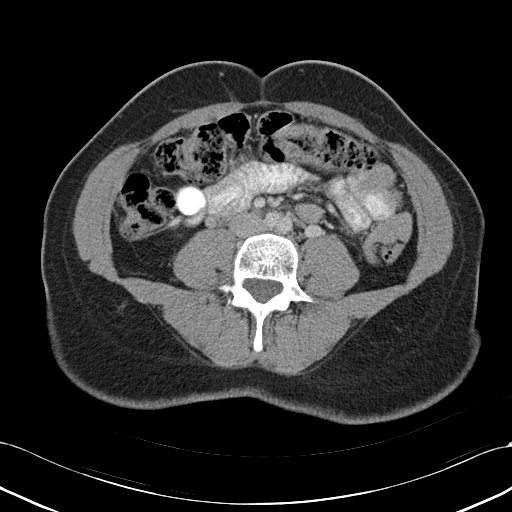
[im 58/91  soft-tissue]
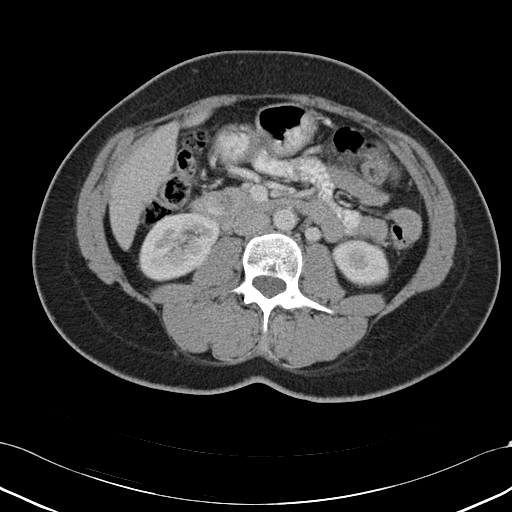
[im 58/91  bone]
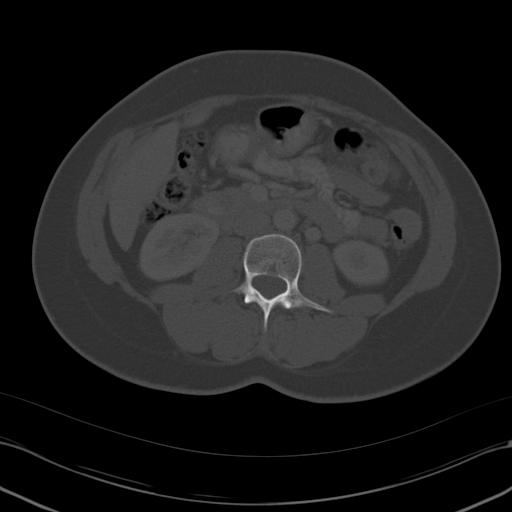
[im 65/91  soft-tissue]
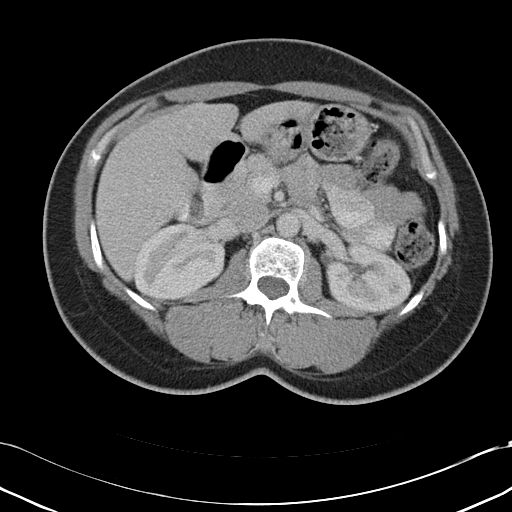
[im 73/91  soft-tissue]
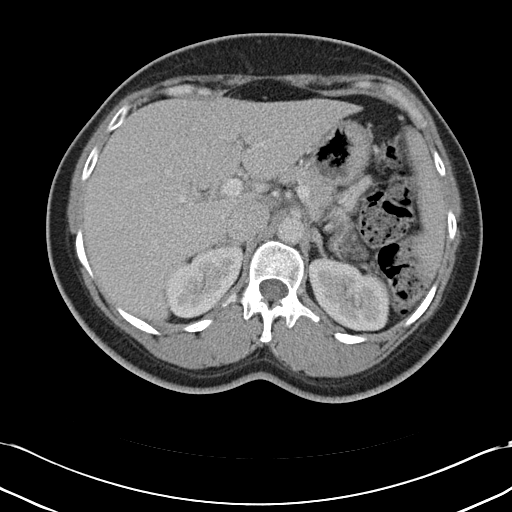
[im 80/91  soft-tissue]
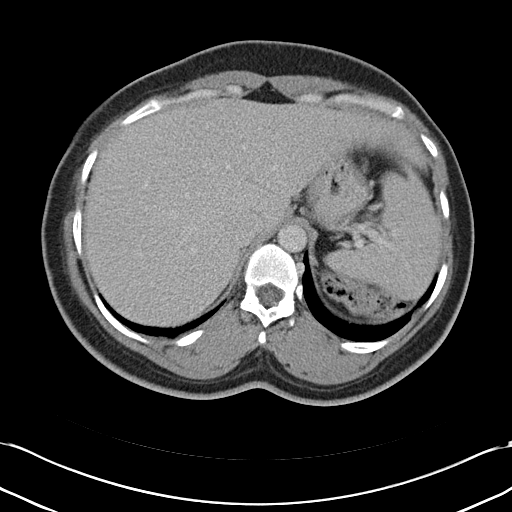
[im 87/91  soft-tissue]
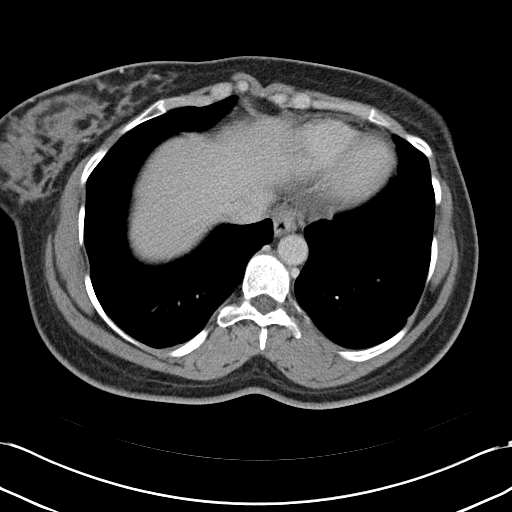

[Series 3: abd_pel_with 3.0 spo cor · coronal · 0.68mm/px · 3 of 89 slices shown]
[im 30/89  soft-tissue]
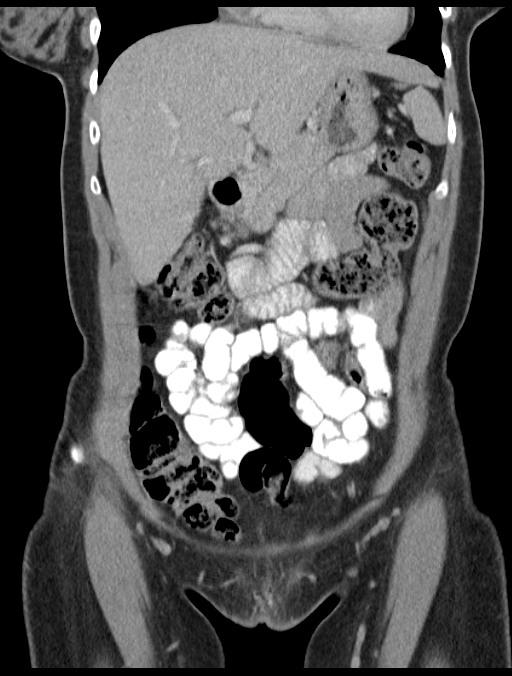
[im 40/89  soft-tissue]
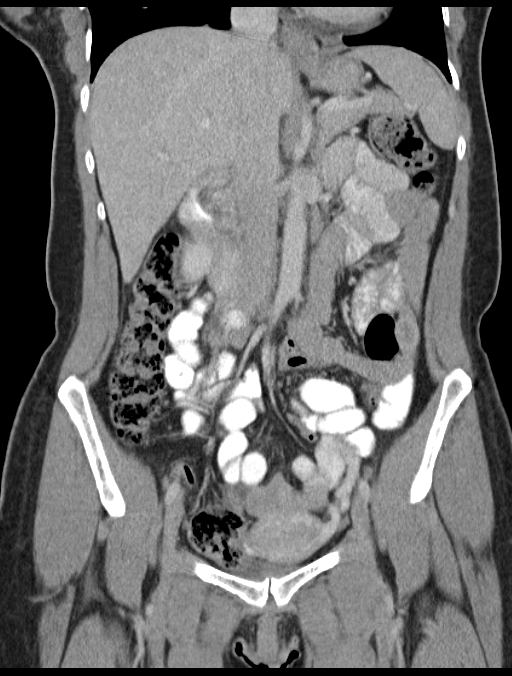
[im 49/89  soft-tissue]
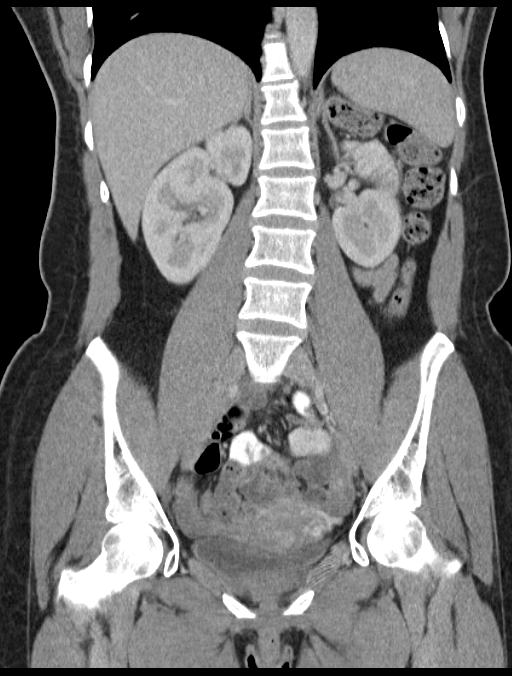

[16 of 46 positions shown; findings below may reference images not displayed]

FINDINGS: Lower chest: Calcified granuloma in the right lower lobe. The
included lung bases are clear.

Hepatobiliary: Postcholecystectomy. No biliary dilatation. No focal
hepatic lesion.

Pancreas: Normal.

Spleen: Normal.

Adrenals/Urinary Tract: Symmetric renal enhancement. No
hydronephrosis. No focal abnormality.

Stomach/Bowel: Stomach physiologically distended. There are no
dilated or thickened small bowel loops. Moderate volume of stool
throughout the colon without colonic wall thickening. The appendix
is normal.

Vascular/Lymphatic: Dilatation of the left greater than right
ovarian veins are unchanged from prior exam. No retroperitoneal
adenopathy. Abdominal aorta is normal in caliber.

Reproductive: Prominent left adnexal on periuterine vascularity.
Ovaries not discretely defined.

Other: No free air, free fluid, or intra-abdominal fluid collection.

Musculoskeletal: There are no acute or suspicious osseous
abnormalities.
IMPRESSION: 1. No acute abnormality in the abdomen/pelvis.
2. Dilatation of the ovarian veins and prominent left adnexal
vascularity, can be seen in the setting of pelvic congestion
syndrome.

## 2016-12-03 ENCOUNTER — Encounter: Payer: Self-pay | Admitting: Adult Health

## 2016-12-03 ENCOUNTER — Encounter (INDEPENDENT_AMBULATORY_CARE_PROVIDER_SITE_OTHER): Payer: Self-pay

## 2016-12-03 ENCOUNTER — Ambulatory Visit (INDEPENDENT_AMBULATORY_CARE_PROVIDER_SITE_OTHER): Payer: Medicaid Other | Admitting: Adult Health

## 2016-12-03 VITALS — BP 120/82 | HR 78 | Ht 72.0 in | Wt 217.0 lb

## 2016-12-03 DIAGNOSIS — Z1159 Encounter for screening for other viral diseases: Secondary | ICD-10-CM

## 2016-12-03 DIAGNOSIS — Z118 Encounter for screening for other infectious and parasitic diseases: Secondary | ICD-10-CM

## 2016-12-03 DIAGNOSIS — Z8619 Personal history of other infectious and parasitic diseases: Secondary | ICD-10-CM

## 2016-12-03 DIAGNOSIS — A749 Chlamydial infection, unspecified: Secondary | ICD-10-CM

## 2016-12-03 NOTE — Patient Instructions (Signed)
Use condoms  

## 2016-12-03 NOTE — Progress Notes (Signed)
Subjective:     Patient ID: Jacqueline [name], female   DOB: [birth year], 36 y.o.   MRN: [id number]  HPI Jacqueline [name] is a 36 year old black female in for proof of cure for recent +chlamydia.Jacqueline [name] and partner took meds but have had sex, with and without a condom.No complaints.   Review of Systems  Patient denies any headaches, hearing loss, fatigue, blurred vision, shortness of breath, chest pain, abdominal pain, problems with bowel movements, urination, or intercourse. No joint pain or mood swings. Reviewed past medical,surgical, social and family history. Reviewed medications and allergies.     Objective:   Physical Exam BP 120/82 (BP Location: Left Arm, Patient Position: Sitting, Cuff Size: Normal)   Pulse 78   Ht 6' (1.829 m)   Wt 217 lb (98.4 kg)   LMP 11/19/2016 (Approximate)   BMI 29.43 kg/m  Skin warm and dry.Pelvic: external genitalia is normal in appearance no lesions, vagina: white discharge without odor,urethra has no lesions or masses noted, cervix:smooth and bulbous, uterus: normal size, shape and contour, non tender, no masses felt, adnexa: no masses or tenderness noted. Bladder is non tender and no masses felt.  GC/CHL obtained.     Assessment:     1. History of chlamydia   2. Screening for viral and chlamydial diseases       Plan:     GC/CHL sent Use condoms Follow up prn

## 2016-12-05 LAB — GC/CHLAMYDIA PROBE AMP
Chlamydia trachomatis, NAA: NEGATIVE
Neisseria gonorrhoeae by PCR: NEGATIVE

## 2016-12-09 ENCOUNTER — Encounter: Payer: Self-pay | Admitting: Adult Health

## 2017-01-17 ENCOUNTER — Emergency Department: Payer: No Typology Code available for payment source

## 2017-01-17 ENCOUNTER — Emergency Department
Admission: EM | Admit: 2017-01-17 | Discharge: 2017-01-17 | Disposition: A | Discharge status: Home / Self Care | Payer: No Typology Code available for payment source | Attending: Emergency Medicine | Admitting: Emergency Medicine

## 2017-01-17 ENCOUNTER — Encounter: Payer: Self-pay | Admitting: Emergency Medicine

## 2017-01-17 DIAGNOSIS — Z87891 Personal history of nicotine dependence: Secondary | ICD-10-CM | POA: Insufficient documentation

## 2017-01-17 DIAGNOSIS — Y999 Unspecified external cause status: Secondary | ICD-10-CM | POA: Diagnosis not present

## 2017-01-17 DIAGNOSIS — M542 Cervicalgia: Secondary | ICD-10-CM | POA: Diagnosis not present

## 2017-01-17 DIAGNOSIS — S199XXA Unspecified injury of neck, initial encounter: Secondary | ICD-10-CM | POA: Diagnosis present

## 2017-01-17 DIAGNOSIS — Y92410 Unspecified street and highway as the place of occurrence of the external cause: Secondary | ICD-10-CM | POA: Diagnosis not present

## 2017-01-17 DIAGNOSIS — Y939 Activity, unspecified: Secondary | ICD-10-CM | POA: Insufficient documentation

## 2017-01-17 MED ORDER — ACETAMINOPHEN 325 MG PO TABS
650.0000 mg | ORAL_TABLET | Freq: Once | ORAL | Status: AC
  Administered 2017-01-17: 650 mg via ORAL
  Filled 2017-01-17: qty 2

## 2017-01-17 MED ORDER — CYCLOBENZAPRINE HCL 5 MG PO TABS: 5.0000 mg | ORAL_TABLET | Freq: Three times a day (TID) | ORAL | 0 refills | Status: AC | PRN

## 2017-01-17 NOTE — ED Notes (Signed)
Reviewed d/c instructions, follow-up care, prescription with patient. Pt verbalized understanding.  
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
## 2017-02-20 ENCOUNTER — Encounter: Payer: Self-pay | Admitting: Adult Health

## 2017-02-20 ENCOUNTER — Ambulatory Visit (INDEPENDENT_AMBULATORY_CARE_PROVIDER_SITE_OTHER): Payer: Medicaid Other | Admitting: Adult Health

## 2017-02-20 VITALS — BP 112/70 | HR 82 | Ht 72.0 in | Wt 217.0 lb

## 2017-02-20 DIAGNOSIS — Z308 Encounter for other contraceptive management: Secondary | ICD-10-CM

## 2017-02-20 DIAGNOSIS — N898 Other specified noninflammatory disorders of vagina: Secondary | ICD-10-CM

## 2017-02-20 DIAGNOSIS — Z113 Encounter for screening for infections with a predominantly sexual mode of transmission: Secondary | ICD-10-CM

## 2017-02-20 LAB — POCT WET PREP (WET MOUNT)
CLUE CELLS WET PREP WHIFF POC: NEGATIVE
WBC WET PREP: POSITIVE

## 2017-02-20 NOTE — Progress Notes (Signed)
Subjective:     Patient ID: Jacqueline [name], female   DOB: [birth year], 36 y.o.   MRN: [id number]  HPI Jacqueline [name] is a 36 year old black female in requesting STD screening and has vaginal odor, no discharge or itching.  Review of Systems Vaginal odor,no discharge or itching  Reviewed past medical,surgical, social and family history. Reviewed medications and allergies.     Objective:   Physical Exam BP 112/70 (BP Location: Left Arm, Patient Position: Sitting, Cuff Size: Large)   Pulse 82   Ht 6' (1.829 m)   Wt 217 lb (98.4 kg)   LMP 02/11/2017 (Approximate)   BMI 29.43 kg/m   Skin warm and dry.Pelvic: external genitalia is normal in appearance no lesions, vagina: white discharge without odor,urethra has no lesions or masses noted, cervix:smooth and bulbous, uterus: normal size, shape and contour, non tender, no masses felt, adnexa: no masses or tenderness noted. Bladder is non tender and no masses felt. Wet prep: +WBCs. GC/CHL obtained.     Assessment:     1. Screening examination for STD (sexually transmitted disease)   2. Vaginal odor       Plan:     GC/CHL sent Follow up prn

## 2017-02-22 ENCOUNTER — Telehealth: Payer: Self-pay | Admitting: *Deleted

## 2017-02-22 LAB — GC/CHLAMYDIA PROBE AMP
Chlamydia trachomatis, NAA: NEGATIVE
Neisseria gonorrhoeae by PCR: NEGATIVE

## 2017-02-22 NOTE — Telephone Encounter (Signed)
Pt called wanting to know results of GC/CHL. Advised pt they were negative. Pt verbalized understanding.

## 2017-06-12 ENCOUNTER — Encounter: Payer: Self-pay | Admitting: Adult Health

## 2017-06-12 ENCOUNTER — Ambulatory Visit (INDEPENDENT_AMBULATORY_CARE_PROVIDER_SITE_OTHER): Payer: Medicaid Other | Admitting: Adult Health

## 2017-06-12 VITALS — BP 112/70 | HR 66 | Ht 71.0 in | Wt 205.5 lb

## 2017-06-12 DIAGNOSIS — Z3041 Encounter for surveillance of contraceptive pills: Secondary | ICD-10-CM

## 2017-06-12 DIAGNOSIS — Z3009 Encounter for other general counseling and advice on contraception: Secondary | ICD-10-CM

## 2017-06-12 DIAGNOSIS — Z309 Encounter for contraceptive management, unspecified: Secondary | ICD-10-CM

## 2017-06-12 DIAGNOSIS — Z01419 Encounter for gynecological examination (general) (routine) without abnormal findings: Secondary | ICD-10-CM | POA: Insufficient documentation

## 2017-06-12 MED ORDER — NORETHIN-ETH ESTRAD-FE BIPHAS 1 MG-10 MCG / 10 MCG PO TABS: 1.0000 | ORAL_TABLET | Freq: Every day | ORAL | 11 refills | Status: DC

## 2017-06-12 NOTE — Progress Notes (Signed)
Patient ID: Jacqueline [name], female   DOB: 09-22-1981, 36 y.o.   MRN: [id number] History of Present Illness: Jacqueline [name] is a 36 year old black female in for well woman gyn exam and refill OCs, has Family planning medicaid.Had normal pap with negative HPV 12/20/14.    Current Medications, Allergies, Past Medical History, Past Surgical History, Family History and Social History were reviewed in [name].     Review of Systems:  Patient denies any headaches, hearing loss, fatigue, blurred vision, shortness of breath, chest pain, abdominal pain, problems with bowel movements, urination, or intercourse. No joint pain or mood swings.She is happy with her OCs.   Physical Exam:BP 112/70 (BP Location: Left Arm, Patient Position: Sitting, Cuff Size: Large)   Pulse 66   Ht 5\' 11"  (1.803 m)   Wt 205 lb 8 oz (93.2 kg)   LMP 06/04/2017   BMI 28.66 kg/m  General:  Well developed, well nourished, no acute distress Skin:  Warm and dry Neck:  Midline trachea, normal thyroid, good ROM, no lymphadenopathy Lungs; Clear to auscultation bilaterally Breast:  No dominant palpable mass, retraction, or nipple discharge Cardiovascular: Regular rate and rhythm Abdomen:  Soft, non tender, no hepatosplenomegaly Pelvic:  External genitalia is normal in appearance, no lesions.  The vagina is normal in appearance. Urethra has no lesions or masses. The cervix is bulbous.  Uterus is felt to be normal size, shape, and contour.  No adnexal masses or tenderness noted.Bladder is non tender, no masses felt. GC/CHL obtained. Extremities/musculoskeletal:  No swelling or varicosities noted, no clubbing or cyanosis Psych:  No mood changes, alert and cooperative,seems happy PHQ 2 score 0.  Impression:  1. Well woman exam with routine gynecological exam   2. Family planning   3. Encounter for surveillance of contraceptive pills      Plan: Meds ordered this encounter  Medications  .  Norethindrone-Ethinyl Estradiol-Fe Biphas (LO LOESTRIN FE) 1 MG-10 MCG / 10 MCG tablet    Sig: Take 1 tablet by mouth daily.    Dispense:  1 Package    Refill:  11    BIN [id number], PCN CN, GRP [id number],ID [id number]    Order Specific Question:   Supervising Provider    Answer:   Jacqueline [name] [[id number]]  GC/CHL sent Check HIV and RPR Physical and pap in 1 year

## 2017-06-13 LAB — HIV ANTIBODY (ROUTINE TESTING W REFLEX): HIV Screen 4th Generation wRfx: NONREACTIVE

## 2017-06-13 LAB — RPR: RPR Ser Ql: NONREACTIVE

## 2017-06-14 LAB — GC/CHLAMYDIA PROBE AMP
CHLAMYDIA, DNA PROBE: NEGATIVE
Neisseria gonorrhoeae by PCR: NEGATIVE

## 2017-06-17 ENCOUNTER — Encounter: Payer: Self-pay | Admitting: Adult Health

## 2017-06-17 ENCOUNTER — Other Ambulatory Visit: Payer: Self-pay | Admitting: Adult Health

## 2017-06-17 MED ORDER — METRONIDAZOLE 500 MG PO TABS: 500.0000 mg | ORAL_TABLET | Freq: Two times a day (BID) | ORAL | 1 refills | Status: DC

## 2017-11-18 ENCOUNTER — Ambulatory Visit: Payer: Medicaid Other | Admitting: Adult Health

## 2017-11-18 ENCOUNTER — Encounter: Payer: Self-pay | Admitting: Adult Health

## 2017-11-18 VITALS — BP 130/82 | HR 79 | Ht 72.0 in | Wt 212.5 lb

## 2017-11-18 DIAGNOSIS — N9489 Other specified conditions associated with female genital organs and menstrual cycle: Secondary | ICD-10-CM

## 2017-11-18 DIAGNOSIS — B373 Candidiasis of vulva and vagina: Secondary | ICD-10-CM

## 2017-11-18 DIAGNOSIS — R102 Pelvic and perineal pain unspecified side: Secondary | ICD-10-CM

## 2017-11-18 DIAGNOSIS — B3731 Acute candidiasis of vulva and vagina: Secondary | ICD-10-CM

## 2017-11-18 DIAGNOSIS — N898 Other specified noninflammatory disorders of vagina: Secondary | ICD-10-CM | POA: Diagnosis not present

## 2017-11-18 DIAGNOSIS — Z113 Encounter for screening for infections with a predominantly sexual mode of transmission: Secondary | ICD-10-CM

## 2017-11-18 LAB — POCT WET PREP (WET MOUNT): WBC WET PREP: POSITIVE

## 2017-11-18 MED ORDER — FLUCONAZOLE 150 MG PO TABS: ORAL_TABLET | ORAL | 1 refills | Status: DC

## 2017-11-18 NOTE — Progress Notes (Signed)
Subjective:     Patient ID: Jacqueline [name], female   DOB: 21-Jul-1981, 37 y.o.   MRN: [id number]  HPI Jacqueline [name] is a 37 year old black female in complaining of vaginal itching and pelvic pain on and off.   Review of Systems +vaginal itching and in groin area  +pelvic pain, on and off, has PCS Reviewed past medical,surgical, social and family history. Reviewed medications and allergies.     Objective:   Physical Exam BP 130/82 (BP Location: Left Arm, Patient Position: Sitting, Cuff Size: Large)   Pulse 79   Ht 6' (1.829 m)   Wt 212 lb 8 oz (96.4 kg)   BMI 28.82 kg/m  Skin warm and dry.Pelvic: external genitalia is normal in appearance no lesions, vagina: white discharge without odor,urethra has no lesions or masses noted, cervix:smooth and bulbous, uterus: normal size, shape and contour, non tender, no masses felt, adnexa: no masses or tenderness noted. Bladder is non tender and no masses felt. Wet prep: + for yeast and +WBCs. Nuswab obtained    Painted vulva/groin area and vagina with gentian violet. Discussed PCS with pt and [facility], is dealing with it.   Assessment:     1. Vulvovaginal candidiasis   2. Vaginal itching   3. Pelvic pain   4. Female pelvic congestion syndrome   5. Screening examination for STD (sexually transmitted disease)       Plan:    Nuswab sent  Meds ordered this encounter  Medications  . fluconazole (DIFLUCAN) 150 MG tablet    Sig: Take 1 now and 1 in 3 days    Dispense:  2 tablet    Refill:  1    Order Specific Question:   Supervising Provider    Answer:   Lazaro ArmsEURE, LUTHER H [[id number]]  F/U prn [facility],if desired

## 2017-11-21 ENCOUNTER — Telehealth: Payer: Self-pay | Admitting: Adult Health

## 2017-11-21 LAB — NUSWAB VAGINITIS PLUS (VG+)
Atopobium vaginae: HIGH Score — AB
BVAB 2: HIGH Score — AB
CANDIDA GLABRATA, NAA: NEGATIVE
CHLAMYDIA TRACHOMATIS, NAA: POSITIVE — AB
Candida albicans, NAA: NEGATIVE
Megasphaera 1: HIGH Score — AB
NEISSERIA GONORRHOEAE, NAA: NEGATIVE
Trich vag by NAA: NEGATIVE

## 2017-11-21 MED ORDER — METRONIDAZOLE 500 MG PO TABS: 500.0000 mg | ORAL_TABLET | Freq: Two times a day (BID) | ORAL | 0 refills | Status: DC

## 2017-11-21 MED ORDER — AZITHROMYCIN 500 MG PO TABS: ORAL_TABLET | ORAL | 0 refills | Status: DC

## 2017-11-21 NOTE — Telephone Encounter (Signed)
Pt aware +BV and chlamydia, will rx flagyl and azithromycin, no sex POC 3/21 at 10 am. Partner to clinic.Marland Kitchen.  [facility] sent

## 2017-11-21 NOTE — Telephone Encounter (Signed)
patient called stating that she is returning Jennifer's phone call. Please contact pt

## 2017-11-21 NOTE — Telephone Encounter (Signed)
Left message to call me.

## 2017-12-19 ENCOUNTER — Ambulatory Visit: Payer: Medicaid Other | Admitting: Adult Health

## 2017-12-19 ENCOUNTER — Other Ambulatory Visit: Payer: Self-pay

## 2017-12-19 ENCOUNTER — Encounter: Payer: Self-pay | Admitting: Adult Health

## 2017-12-19 VITALS — BP 122/80 | HR 71 | Ht 72.0 in | Wt 215.0 lb

## 2017-12-19 DIAGNOSIS — Z8619 Personal history of other infectious and parasitic diseases: Secondary | ICD-10-CM | POA: Diagnosis not present

## 2017-12-19 DIAGNOSIS — N898 Other specified noninflammatory disorders of vagina: Secondary | ICD-10-CM | POA: Diagnosis not present

## 2017-12-19 DIAGNOSIS — Z3202 Encounter for pregnancy test, result negative: Secondary | ICD-10-CM | POA: Diagnosis not present

## 2017-12-19 DIAGNOSIS — Z3041 Encounter for surveillance of contraceptive pills: Secondary | ICD-10-CM

## 2017-12-19 DIAGNOSIS — Z113 Encounter for screening for infections with a predominantly sexual mode of transmission: Secondary | ICD-10-CM | POA: Diagnosis not present

## 2017-12-19 LAB — POCT URINE PREGNANCY: PREG TEST UR: NEGATIVE

## 2017-12-19 MED ORDER — NORETHIN-ETH ESTRAD-FE BIPHAS 1 MG-10 MCG / 10 MCG PO TABS: 1.0000 | ORAL_TABLET | Freq: Every day | ORAL | 11 refills | Status: DC

## 2017-12-19 MED ORDER — TRIAMCINOLONE ACETONIDE 0.5 % EX CREA: TOPICAL_CREAM | CUTANEOUS | 0 refills | Status: DC

## 2017-12-19 NOTE — Progress Notes (Signed)
Subjective:     Patient ID: Jacqueline [name], female   DOB: March 19, 1981, 37 y.o.   MRN: [id number]  HPI Jacqueline [name] is a 37 year old black female in for proof of cure for recent +chlamydia.She has had sex, and has some itching in vaginal area.She also requests refills on lo loestrin.   Review of Systems Vaginal itch more to outside Reviewed past medical,surgical, social and family history. Reviewed medications and allergies.     Objective:   Physical Exam BP 122/80 (BP Location: Left Arm, Patient Position: Sitting, Cuff Size: Large)   Pulse 71   Ht 6' (1.829 m)   Wt 215 lb (97.5 kg)   BMI 29.16 kg/m UPT negative.    Skin warm and dry.Pelvic: external genitalia is normal in appearance no lesions, vagina: white discharge without odor,urethra has no lesions or masses noted, cervix:smooth and bulbous, uterus: normal size, shape and contour, non tender, no masses felt, adnexa: no masses or tenderness noted. Bladder is non tender and no masses felt.  GC/CHL obtained.  Will give kenalog cream to use on outside tissues for itching.  Assessment:     1. History of chlamydia   2. Pregnancy examination or test, negative result   3. Screening examination for STD (sexually transmitted disease)   4. Vaginal itching   5. Encounter for surveillance of contraceptive pills       Plan:     Meds ordered this encounter  Medications  . triamcinolone cream (KENALOG) 0.5 %    Sig: Apply bid prn to affected area    Dispense:  30 g    Refill:  0    Order Specific Question:   Supervising Provider    Answer:   Despina HiddenEURE, LUTHER H [[id number]]  . Norethindrone-Ethinyl Estradiol-Fe Biphas (LO LOESTRIN FE) 1 MG-10 MCG / 10 MCG tablet    Sig: Take 1 tablet by mouth daily.    Dispense:  1 Package    Refill:  11    BIN [id number], PCN CN, GRP [id number],ID [id number]    Order Specific Question:   Supervising Provider    Answer:   Lazaro ArmsEURE, LUTHER H [[id number]]  Use condoms F/U prn

## 2017-12-21 LAB — GC/CHLAMYDIA PROBE AMP
Chlamydia trachomatis, NAA: NEGATIVE
Neisseria gonorrhoeae by PCR: NEGATIVE

## 2017-12-25 ENCOUNTER — Encounter: Payer: Self-pay | Admitting: Adult Health

## 2018-08-19 ENCOUNTER — Other Ambulatory Visit (HOSPITAL_COMMUNITY)
Admission: RE | Admit: 2018-08-19 | Discharge: 2018-08-19 | Disposition: A | Discharge status: Home / Self Care | Payer: BC Managed Care – PPO | Source: Ambulatory Visit | Attending: Adult Health | Admitting: Adult Health

## 2018-08-19 ENCOUNTER — Encounter: Payer: Self-pay | Admitting: Adult Health

## 2018-08-19 ENCOUNTER — Ambulatory Visit (INDEPENDENT_AMBULATORY_CARE_PROVIDER_SITE_OTHER): Payer: BC Managed Care – PPO | Admitting: Adult Health

## 2018-08-19 VITALS — BP 140/80 | HR 77 | Ht 72.0 in | Wt 218.0 lb

## 2018-08-19 DIAGNOSIS — K649 Unspecified hemorrhoids: Secondary | ICD-10-CM

## 2018-08-19 DIAGNOSIS — L292 Pruritus vulvae: Secondary | ICD-10-CM | POA: Insufficient documentation

## 2018-08-19 DIAGNOSIS — Z3041 Encounter for surveillance of contraceptive pills: Secondary | ICD-10-CM

## 2018-08-19 DIAGNOSIS — Z01419 Encounter for gynecological examination (general) (routine) without abnormal findings: Secondary | ICD-10-CM | POA: Diagnosis present

## 2018-08-19 DIAGNOSIS — A630 Anogenital (venereal) warts: Secondary | ICD-10-CM | POA: Insufficient documentation

## 2018-08-19 MED ORDER — NORETHIN-ETH ESTRAD-FE BIPHAS 1 MG-10 MCG / 10 MCG PO TABS: 1.0000 | ORAL_TABLET | Freq: Every day | ORAL | 11 refills | Status: DC

## 2018-08-19 MED ORDER — NYSTATIN-TRIAMCINOLONE 100000-0.1 UNIT/GM-% EX CREA: 1.0000 "application " | TOPICAL_CREAM | Freq: Two times a day (BID) | CUTANEOUS | 1 refills | Status: DC

## 2018-08-19 NOTE — Progress Notes (Signed)
Patient ID: Jacqueline [name], female   DOB: March 10, 1981, 37 y.o.   MRN: [id number] History of Present Illness:  Jacqueline [name] is a 37 year old black female in for well woman gyn exam and pap. [facility]   Current Medications, Allergies, Past Medical History, Past Surgical History, Family History and Social History were reviewed in [name].     Review of Systems: Patient denies any headaches, hearing loss, fatigue, blurred vision, shortness of breath, chest pain, abdominal pain, problems with bowel movements, urination, or intercourse. No joint pain or mood swings. No periods with OCs.  Has itching in vulva area +hemorrhoid, she says it bothers her, has had for 9 years  She is getting mammogram Friday at [facility] in [place].   Physical Exam:BP 140/80 (BP Location: Left Arm, Patient Position: Sitting, Cuff Size: Normal)   Pulse 77   Ht 6' (1.829 m)   Wt 218 lb (98.9 kg)   LMP 02/12/2018 (Exact Date)   BMI 29.57 kg/m UPT negative. General:  Well developed, well nourished, no acute distress Skin:  Warm and dry Neck:  Midline trachea, normal thyroid, good ROM, no lymphadenopathy Lungs; Clear to auscultation bilaterally Breast:  No dominant palpable mass, retraction, or nipple discharge Cardiovascular: Regular rate and rhythm Abdomen:  Soft, non tender, no hepatosplenomegaly Pelvic:  External genitalia is normal in appearance, has some excoration from scratching.  The vagina is normal in appearance. Urethra has no lesions or masses. The cervix is bulbous. Pap with GC/CHL and HPV performed. Uterus is felt to be normal size, shape, and contour.  No adnexal masses or tenderness noted.Bladder is non tender, no masses felt. Rectal: +external hemorrhoids and has some perianal warts and color changes too, on the right. Will refer to Dr Henreitta LeberBridges for excision. Extremities/musculoskeletal:  No swelling or varicosities noted, no clubbing or cyanosis Psych:  No mood changes, alert  and cooperative,seems happy PHQ 2 score 0. Examination chaperoned by Marchelle FolksAmanda Rash LPN. Will Rx mytrex for itching   Impression: 1. Encounter for gynecological examination with Papanicolaou smear of cervix   2. Hemorrhoids, unspecified hemorrhoid type   3. Anogenital warts in female   4. Vulvar itching   5. Encounter for surveillance of contraceptive pills       Plan: Meds ordered this encounter  Medications  . Norethindrone-Ethinyl Estradiol-Fe Biphas (LO LOESTRIN FE) 1 MG-10 MCG / 10 MCG tablet    Sig: Take 1 tablet by mouth daily.    Dispense:  1 Package    Refill:  11    BIN [id number], PCN CN, GRP [id number],ID [id number]    Order Specific Question:   Supervising Provider    Answer:   Duane LopeEURE, LUTHER H [[id number]]  . nystatin-triamcinolone (MYCOLOG II) cream    Sig: Apply 1 application topically 2 (two) times daily.    Dispense:  30 g    Refill:  1    Order Specific Question:   Supervising Provider    Answer:   Lazaro ArmsEURE, LUTHER H [[id number]]  Physical in 1 year Pap in 3 if normal Referred to Dr Henreitta LeberBridges to evaluate for hemorrhoid and warts excision.

## 2018-08-21 LAB — CYTOLOGY - PAP
CHLAMYDIA, DNA PROBE: NEGATIVE
Diagnosis: NEGATIVE
HPV (WINDOPATH): NOT DETECTED
NEISSERIA GONORRHEA: NEGATIVE

## 2018-09-09 ENCOUNTER — Other Ambulatory Visit: Payer: Self-pay | Admitting: *Deleted

## 2018-12-17 ENCOUNTER — Ambulatory Visit (INDEPENDENT_AMBULATORY_CARE_PROVIDER_SITE_OTHER): Payer: BC Managed Care – PPO | Admitting: Adult Health

## 2018-12-17 ENCOUNTER — Encounter: Payer: Self-pay | Admitting: Adult Health

## 2018-12-17 ENCOUNTER — Other Ambulatory Visit: Payer: Self-pay

## 2018-12-17 VITALS — BP 126/72 | HR 69 | Ht 72.0 in | Wt 212.0 lb

## 2018-12-17 DIAGNOSIS — Z3202 Encounter for pregnancy test, result negative: Secondary | ICD-10-CM | POA: Diagnosis not present

## 2018-12-17 DIAGNOSIS — B373 Candidiasis of vulva and vagina: Secondary | ICD-10-CM | POA: Diagnosis not present

## 2018-12-17 DIAGNOSIS — Z3041 Encounter for surveillance of contraceptive pills: Secondary | ICD-10-CM

## 2018-12-17 DIAGNOSIS — N898 Other specified noninflammatory disorders of vagina: Secondary | ICD-10-CM | POA: Insufficient documentation

## 2018-12-17 DIAGNOSIS — B3731 Acute candidiasis of vulva and vagina: Secondary | ICD-10-CM

## 2018-12-17 LAB — POCT WET PREP (WET MOUNT)
Clue Cells Wet Prep Whiff POC: NEGATIVE
Trichomonas Wet Prep HPF POC: ABSENT
WBC, Wet Prep HPF POC: POSITIVE

## 2018-12-17 LAB — POCT URINE PREGNANCY: PREG TEST UR: NEGATIVE

## 2018-12-17 MED ORDER — NORETHIN-ETH ESTRAD-FE BIPHAS 1 MG-10 MCG / 10 MCG PO TABS: 1.0000 | ORAL_TABLET | Freq: Every day | ORAL | 4 refills | Status: DC

## 2018-12-17 MED ORDER — NORETHIN-ETH ESTRAD-FE BIPHAS 1 MG-10 MCG / 10 MCG PO TABS: 1.0000 | ORAL_TABLET | Freq: Every day | ORAL | 11 refills | Status: DC

## 2018-12-17 MED ORDER — FLUCONAZOLE 150 MG PO TABS: ORAL_TABLET | ORAL | 1 refills | Status: DC

## 2018-12-17 MED ORDER — VALACYCLOVIR HCL 1 G PO TABS: 1000.0000 mg | ORAL_TABLET | Freq: Two times a day (BID) | ORAL | 3 refills | Status: DC

## 2018-12-17 NOTE — Progress Notes (Signed)
Patient ID: Jacqueline [name], female   DOB: 11-29-1980, 38 y.o.   MRN: [id number] History of Present Illness: Jacqueline [name] is a 38 year old black female in complaining of vaginal itching and irritation.And she says needs refill on OCs. [facility].    Current Medications, Allergies, Past Medical History, Past Surgical History, Family History and Social History were reviewed in [facility] record.     Review of Systems: +vaginal itching and irritation Not currently sexually active   Physical Exam:BP 126/72 (BP Location: Left Arm, Patient Position: Sitting, Cuff Size: Normal)   Pulse 69   Ht 6' (1.829 m)   Wt 212 lb (96.2 kg)   LMP 10/05/2018   BMI 28.75 kg/m UPT negative.  General:  Well developed, well nourished, no acute distress Skin:  Warm and dry Pelvic:  External genitalia has areas of excoriation bilaterally inner labia.  The vagina has red side walls, and clumpy yellowish discharge with no odor. Urethra has no lesions or masses. The cervix is bulbous.  Uterus is felt to be normal size, shape, and contour.  No adnexal masses or tenderness noted.Bladder is non tender, no masses felt. Nuswab obtained. Wet prep: +WBC and Yeast, no clue cells or rich seen.  Painted vulva and vagina with gentian violet.  Psych:  No mood changes, alert and cooperative,seems happy Fall risk is low. Examination chaperoned by Jacqueline [name] CMA.   Impression:  1. Yeast vaginitis   2. Vaginal itching   3. Vaginal discharge   4. Encounter for surveillance of contraceptive pills   5. Pregnancy test negative      Plan: Meds ordered this encounter  Medications  . DISCONTD: Norethindrone-Ethinyl Estradiol-Fe Biphas (LO LOESTRIN FE) 1 MG-10 MCG / 10 MCG tablet    Sig: Take 1 tablet by mouth daily.    Dispense:  1 Package    Refill:  11    BIN [id number], PCN CN, GRP [id number]    Order Specific Question:   Supervising Provider    Answer:   Jacqueline [name] [[id number]]  .  Norethindrone-Ethinyl Estradiol-Fe Biphas (LO LOESTRIN FE) 1 MG-10 MCG / 10 MCG tablet    Sig: Take 1 tablet by mouth daily.    Dispense:  3 Package    Refill:  4    BIN [id number], PCN CN, GRP [id number]    Order Specific Question:   Supervising Provider    Answer:   Jacqueline [name] [[id number]]  . fluconazole (DIFLUCAN) 150 MG tablet    Sig: Take 1 now and repeat 1 in 3 days    Dispense:  2 tablet    Refill:  1    Order Specific Question:   Supervising Provider    Answer:   Jacqueline [name] [[id number]]  . valACYclovir (VALTREX) 1000 MG tablet    Sig: Take 1 tablet (1,000 mg total) by mouth 2 (two) times daily.    Dispense:  20 tablet    Refill:  3    Order Specific Question:   Supervising Provider    Answer:   Jacqueline [name] [[id number]]  Just pat do no rub or scratch  Nuswab sent No sex for now F/U prn

## 2018-12-25 LAB — NUSWAB VAGINITIS PLUS (VG+)
CHLAMYDIA TRACHOMATIS, NAA: NEGATIVE
Candida albicans, NAA: POSITIVE — AB
Candida glabrata, NAA: NEGATIVE
Neisseria gonorrhoeae, NAA: NEGATIVE
TRICH VAG BY NAA: NEGATIVE

## 2020-02-04 ENCOUNTER — Encounter: Payer: Self-pay | Admitting: Adult Health

## 2020-02-04 ENCOUNTER — Other Ambulatory Visit: Payer: Self-pay

## 2020-02-04 ENCOUNTER — Ambulatory Visit: Payer: BC Managed Care – PPO | Admitting: Adult Health

## 2020-02-04 VITALS — BP 127/79 | HR 72 | Ht 72.0 in | Wt 230.0 lb

## 2020-02-04 DIAGNOSIS — Z113 Encounter for screening for infections with a predominantly sexual mode of transmission: Secondary | ICD-10-CM

## 2020-02-04 DIAGNOSIS — N898 Other specified noninflammatory disorders of vagina: Secondary | ICD-10-CM | POA: Diagnosis not present

## 2020-02-04 LAB — POCT WET PREP (WET MOUNT): Clue Cells Wet Prep Whiff POC: NEGATIVE

## 2020-02-04 MED ORDER — METRONIDAZOLE 500 MG PO TABS: 500.0000 mg | ORAL_TABLET | Freq: Two times a day (BID) | ORAL | 0 refills | Status: DC

## 2020-02-04 MED ORDER — FLUCONAZOLE 150 MG PO TABS: ORAL_TABLET | ORAL | 1 refills | Status: DC

## 2020-02-04 NOTE — Progress Notes (Signed)
  Subjective:     Patient ID: Jacqueline [name], female   DOB: 03-23-1981, 39 y.o.   MRN: [id number]  HPI Jacqueline [name] is a 39 year old black female, single, G3P3, in complaining of increased vaginal discharge with itching and burning and wants STD  testing PCP is [facility] in [place].   Review of Systems Has increased vaginal discharge Has vaginal itching and burning, feels like BV and yeast she says  Reviewed past medical,surgical, social and family history. Reviewed medications and allergies.     Objective:   Physical Exam BP 127/79 (BP Location: Left Arm, Patient Position: Sitting, Cuff Size: Normal)   Pulse 72   Ht 6' (1.829 m)   Wt 230 lb (104.3 kg)   BMI 31.19 kg/m   Skin warm and dry.Pelvic: external genitalia is normal in appearance no lesions, vagina: white creamy discharge without odor,urethra has no lesions or masses noted, cervix:smooth and bulbous, uterus: normal size, shape and contour, non tender, no masses felt, adnexa: no masses or tenderness noted. Bladder is non tender and no masses felt. Wet prep:  +WBCs. Nuswab obtained Examination chaperoned by Stoney Bang lPN    Assessment:     1. Vaginal discharge Nuswab sent   2. Screening examination for STD (sexually transmitted disease) Nuswab sent  3. Vaginal itching nuswab sent  Since she is itching and burning will treat with diflucan and flagyl  Meds ordered this encounter  Medications  . fluconazole (DIFLUCAN) 150 MG tablet    Sig: Take 1 now and repeat 1 in 3 days    Dispense:  2 tablet    Refill:  1    Order Specific Question:   Supervising Provider    Answer:   EURE, LUTHER H [[id number]]  . metroNIDAZOLE (FLAGYL) 500 MG tablet    Sig: Take 1 tablet (500 mg total) by mouth 2 (two) times daily.    Dispense:  14 tablet    Refill:  0    Order Specific Question:   Supervising Provider    Answer:   Lazaro Arms [[id number]]      Plan:     Follow up prn

## 2020-02-07 LAB — SPECIMEN STATUS REPORT

## 2020-02-07 LAB — NUSWAB VAGINITIS PLUS (VG+)
Candida albicans, NAA: NEGATIVE
Candida glabrata, NAA: NEGATIVE
Chlamydia trachomatis, NAA: NEGATIVE
Neisseria gonorrhoeae, NAA: NEGATIVE
Trich vag by NAA: NEGATIVE

## 2020-08-12 ENCOUNTER — Telehealth: Payer: Self-pay | Admitting: Obstetrics & Gynecology

## 2020-08-12 ENCOUNTER — Telehealth: Payer: Self-pay | Admitting: Adult Health

## 2020-08-12 ENCOUNTER — Telehealth: Payer: Self-pay | Admitting: *Deleted

## 2020-08-12 MED ORDER — NYSTATIN-TRIAMCINOLONE 100000-0.1 UNIT/GM-% EX OINT: 1.0000 "application " | TOPICAL_OINTMENT | Freq: Two times a day (BID) | CUTANEOUS | 11 refills | Status: DC

## 2020-08-12 NOTE — Telephone Encounter (Signed)
Patient called stating that she would like for [facility] to call her something in for her irritation in her vagina. Pt states that she changed soaps and now she had irritation also pt states that she is not able to wait for an appointment. Pt states that there irritation is aggressive. Please contact pt

## 2020-08-12 NOTE — Telephone Encounter (Signed)
Pt reports that she is having itching and burning both inside and outside. Not sexually active. She has recently changed soaps. She would like something called in to help with this, she can't stand it over the weekend. Unable to come in today.

## 2020-08-12 NOTE — Telephone Encounter (Signed)
Attempted to call patient to inform her of rx at pharmacy. No answer and No vm

## 2020-11-10 ENCOUNTER — Ambulatory Visit (INDEPENDENT_AMBULATORY_CARE_PROVIDER_SITE_OTHER): Payer: BC Managed Care – PPO | Admitting: Advanced Practice Midwife

## 2020-11-10 ENCOUNTER — Other Ambulatory Visit (HOSPITAL_COMMUNITY)
Admission: RE | Admit: 2020-11-10 | Discharge: 2020-11-10 | Disposition: A | Discharge status: Home / Self Care | Payer: BC Managed Care – PPO | Source: Ambulatory Visit | Attending: Advanced Practice Midwife | Admitting: Advanced Practice Midwife

## 2020-11-10 ENCOUNTER — Other Ambulatory Visit: Payer: Self-pay

## 2020-11-10 VITALS — BP 135/84 | HR 73 | Ht 72.0 in | Wt 227.0 lb

## 2020-11-10 DIAGNOSIS — A6004 Herpesviral vulvovaginitis: Secondary | ICD-10-CM

## 2020-11-10 DIAGNOSIS — Z113 Encounter for screening for infections with a predominantly sexual mode of transmission: Secondary | ICD-10-CM | POA: Insufficient documentation

## 2020-11-10 DIAGNOSIS — A630 Anogenital (venereal) warts: Secondary | ICD-10-CM

## 2020-11-10 MED ORDER — VALACYCLOVIR HCL 1 G PO TABS: 1000.0000 mg | ORAL_TABLET | Freq: Every day | ORAL | 11 refills | Status: DC

## 2020-11-10 MED ORDER — IMIQUIMOD 5 % EX CREA: TOPICAL_CREAM | CUTANEOUS | 3 refills | Status: DC

## 2020-11-10 NOTE — Progress Notes (Signed)
[facility] Visit  Patient name: Jacqueline [name] MRN [id number]  Date of birth: 31-May-1981  CC & HPI:  Jacqueline [name] is a 40 y.o. African American female presenting today for vulvar itch for several months.  She tried Mycolog cream in November for about a month, relieved itch some.  Noticed a white line where the itch was.  Also had a HSV outbreak about a week ago, out of meds.  Would like a full STD screen and also a rx for daily valtrex   Pertinent History Reviewed:  Medical & Surgical Hx:   Past Medical History:  Diagnosis Date  . Anal wart 10/27/2015  . Back pain 05/09/2015  . BV (bacterial vaginosis) 10/27/2015  . Chlamydia infection 11/05/2016  . Constipated 09/16/2014  . Constipation 08/02/2015  . Contraceptive management 12/20/2014  . Hemorrhoids 08/02/2015  . Herpes infection 03/10/2015  . Herpes simplex without mention of complication   . LLQ pain 08/02/2015  . 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 Vaginal discharge 10/27/2015  . Vaginal irritation 09/16/2014  . Vaginal odor 10/27/2015   Past Surgical History:  Procedure Laterality Date  . CHOLECYSTECTOMY N/A 12/30/2013   Procedure: LAPAROSCOPIC CHOLECYSTECTOMY;  Surgeon: Dalia Heading, MD;  Location: [facility];  Service: General;  Laterality: N/A;  . ENDOSCOPIC RETROGRADE CHOLANGIOPANCREATOGRAPHY (ERCP) WITH PROPOFOL  2015  . ERCP N/A 12/29/2013   Procedure: ENDOSCOPIC RETROGRADE CHOLANGIOPANCREATOGRAPHY (ERCP);  Surgeon: Corbin Ade, MD;  Location: [facility];  Service: Endoscopy;  Laterality: N/A;  and stone extraction  . None    . SPHINCTEROTOMY N/A 12/29/2013   Procedure: SPHINCTEROTOMY;  Surgeon: Corbin Ade, MD;  Location: [facility];  Service: Endoscopy;  Laterality: N/A;   Family History  Problem Relation Age of Onset  . Hypertension Mother   . Breast cancer Mother   . COPD  Maternal Grandmother   . Cancer Maternal Grandmother        lung  . Diabetes Maternal Grandmother   . Hypertension Sister   . Colon cancer Neg Hx     Current Outpatient Medications:  .  [START ON 11/11/2020] imiquimod (ALDARA) 5 % cream, Apply topically 3 (three) times a week., Disp: 12 each, Rfl: 3 .  Multiple Vitamins-Calcium (ONE-A-DAY WOMENS PO), Take by mouth daily., Disp: , Rfl:  .  cetirizine (ZYRTEC) 10 MG tablet, Take by mouth. (Patient not taking: Reported on 11/10/2020), Disp: , Rfl:  .  fluticasone (FLONASE) 50 MCG/ACT nasal spray, Place 2 sprays into both nostrils as needed.  (Patient not taking: Reported on 11/10/2020), Disp: , Rfl:  .  valACYclovir (VALTREX) 1000 MG tablet, Take 1 tablet (1,000 mg total) by mouth daily., Disp: 30 tablet, Rfl: 11 Social History: Reviewed -  reports that she quit smoking about 8 years ago. Her smoking use included cigars. She quit after 3.00 years of use. She has never used smokeless tobacco.  Review of Systems:   Constitutional: Negative for fever and chills Eyes: Negative for visual disturbances Respiratory: Negative for shortness of breath, dyspnea Cardiovascular: Negative for chest pain or palpitations  Gastrointestinal: Negative for vomiting, diarrhea and constipation; no abdominal pain Genitourinary: Negative for dysuria and urgency, vaginal irritation or itching Musculoskeletal: Negative for back pain, joint pain, myalgias  Neurological: Negative for dizziness and headaches    Objective Findings:    Physical  Examination: Vitals:   11/10/20 1518  BP: 135/84  Pulse: 73   General appearance - well appearing, and in no distress Mental status - alert, oriented to person, place, and time Chest:  Normal respiratory effort Heart - normal rate and regular rhythm Abdomen:  Soft, nontender Pelvic: what appears to be 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  No evidence of HSV outbreak.  SSE--normal appearing, Nuswab  collected Musculoskeletal:  Normal range of motion without pain Extremities:  No edema    No results found for this or any previous visit (from the past 24 hour(s)).    Assessment & Plan:  A:   condolyoma P:  aldara 3x/week until gone.     Rx valtrex, take 1/2 tab daily   Return for If you have any problems.  [name] CNM 11/10/2020 3:55 PM

## 2020-11-14 LAB — CERVICOVAGINAL ANCILLARY ONLY
Chlamydia: NEGATIVE
Comment: NEGATIVE
Comment: NEGATIVE
Comment: NORMAL
Neisseria Gonorrhea: NEGATIVE
Trichomonas: NEGATIVE

## 2021-01-17 ENCOUNTER — Telehealth: Payer: Self-pay | Admitting: Genetic Counselor

## 2021-01-17 NOTE — Telephone Encounter (Signed)
Received a genetic counseling referral from the [facility] for fhx of breast cancer. Jacqueline [name] has been cld and scheduled to see [name] on 5/2 at 11am. Pt aware to arrive 15 minutes early.

## 2021-01-30 ENCOUNTER — Inpatient Hospital Stay: Payer: BC Managed Care – PPO | Attending: Adult Health | Admitting: Genetic Counselor

## 2021-01-30 ENCOUNTER — Other Ambulatory Visit: Payer: Self-pay

## 2021-01-30 ENCOUNTER — Inpatient Hospital Stay: Payer: BC Managed Care – PPO

## 2021-01-30 DIAGNOSIS — Z803 Family history of malignant neoplasm of breast: Secondary | ICD-10-CM | POA: Diagnosis not present

## 2021-01-30 DIAGNOSIS — Z8041 Family history of malignant neoplasm of ovary: Secondary | ICD-10-CM

## 2021-01-30 DIAGNOSIS — Z801 Family history of malignant neoplasm of trachea, bronchus and lung: Secondary | ICD-10-CM

## 2021-01-30 LAB — GENETIC SCREENING ORDER

## 2021-01-31 ENCOUNTER — Encounter: Payer: Self-pay | Admitting: Genetic Counselor

## 2021-01-31 DIAGNOSIS — Z8041 Family history of malignant neoplasm of ovary: Secondary | ICD-10-CM | POA: Insufficient documentation

## 2021-01-31 DIAGNOSIS — Z803 Family history of malignant neoplasm of breast: Secondary | ICD-10-CM

## 2021-01-31 DIAGNOSIS — Z801 Family history of malignant neoplasm of trachea, bronchus and lung: Secondary | ICD-10-CM

## 2021-01-31 HISTORY — DX: Family history of malignant neoplasm of ovary: Z80.41

## 2021-01-31 HISTORY — DX: Family history of malignant neoplasm of trachea, bronchus and lung: Z80.1

## 2021-01-31 HISTORY — DX: Family history of malignant neoplasm of breast: Z80.3

## 2021-01-31 NOTE — Progress Notes (Signed)
REFERRING PROVIDER: Department of [employer]. [facility] [address] [place], [place] [address]   PRIMARY PROVIDER:  [facility]   PRIMARY REASON FOR VISIT:  1. Family history of breast cancer   2. FH: ovarian cancer   3. Family history of lung cancer     HISTORY OF PRESENT ILLNESS:   Jacqueline [name], a 40 y.o. female, was seen for a [place] cancer genetics consultation at the request of the [place] due to a family history of cancer.  Jacqueline [name] presents to clinic today to discuss the possibility of a hereditary predisposition to cancer, to discuss genetic testing, and to further clarify her future cancer risks, as well as potential cancer risks for family members.   Jacqueline [name] is a 40 y.o. female with no personal history of cancer.    CANCER HISTORY:  Oncology History   No history exists.   RISK FACTORS:  Menarche was at age 41.  First live birth at age 47.  OCP use for approximately 18 years.  Ovaries intact: yes.  Hysterectomy: no.  HRT use: 0 years. Colonoscopy: no; not examined. Mammogram within the last year: no. Number of breast biopsies: 0. Up to date with pelvic exams: yes. Any excessive radiation exposure in the past: no  Past Medical History:  Diagnosis Date  . Anal wart 10/27/2015  . Back pain 05/09/2015  . BV (bacterial vaginosis) 10/27/2015  . Chlamydia infection 11/05/2016  . Constipated 09/16/2014  . Constipation 08/02/2015  . Contraceptive management 12/20/2014  . Family history of breast cancer 01/31/2021  . Family history of lung cancer 01/31/2021  . FH: ovarian cancer 01/31/2021  . Hemorrhoids 08/02/2015  . Herpes infection 03/10/2015  . Herpes simplex without mention of complication   . LLQ pain 08/02/2015  . 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 Vaginal  discharge 10/27/2015  . Vaginal irritation 09/16/2014  . Vaginal odor 10/27/2015    Past Surgical History:  Procedure Laterality Date  . CHOLECYSTECTOMY N/A 12/30/2013   Procedure: LAPAROSCOPIC CHOLECYSTECTOMY;  Surgeon: Jamesetta So, MD;  Location: [facility];  Service: General;  Laterality: N/A;  . ENDOSCOPIC RETROGRADE CHOLANGIOPANCREATOGRAPHY (ERCP) WITH PROPOFOL  2015  . ERCP N/A 12/29/2013   Procedure: ENDOSCOPIC RETROGRADE CHOLANGIOPANCREATOGRAPHY (ERCP);  Surgeon: Daneil Dolin, MD;  Location: [facility];  Service: Endoscopy;  Laterality: N/A;  and stone extraction  . None    . SPHINCTEROTOMY N/A 12/29/2013   Procedure: SPHINCTEROTOMY;  Surgeon: Daneil Dolin, MD;  Location: [facility];  Service: Endoscopy;  Laterality: N/A;    Social History   Socioeconomic History  . Marital status: Single    Spouse name: Not on file  . Number of children: 3  . Years of education: Not on file  . Highest education level: Not on file  Occupational History  . Not on file  Tobacco Use  . Smoking status: Current smoker; cigars  Vaping Use  . Vaping Use: Never used  Substance and Sexual Activity  . Alcohol use: Yes; 2-3x/month    Comment: wine occ  . Drug use: No  Other Topics Concern  . Not on file  Social History Narrative  . Not on file   Social Determinants of Health   Financial Resource Strain: Not on file  Food Insecurity: Not on file  Transportation Needs: Not on file  Physical Activity: Not on file  Stress: Not on file  Social Connections: Not on file     FAMILY HISTORY:  We obtained a detailed, 4-generation family history.  Significant diagnoses are listed below: Family History  Problem Relation Age of Onset  . Breast cancer Mother 2       ER+  . Ovarian cancer Maternal Grandmother        dx 72s  . Lung cancer Maternal Grandmother        dx 64s  . Esophageal cancer Maternal Grandmother        dx after 50  . Breast cancer Paternal Aunt        dx before 48  . Breast  cancer Paternal Aunt        dx before 68  . Ovarian cancer Paternal Aunt        dx 56s  . Lung cancer Paternal Aunt        dx 45s    Jacqueline [name] has two daughters (age 63, 66) and one son (age 19).  She has one full sister, age 69, and one paternal half brother, age 39.  Jacqueline [name]'s mother, age 12, was diagnosed with breast cancer at age 14 and has not had genetic testing. Jacqueline [name]'s maternal grandmother had ovarian cancer in her 30s and lung and esophagus cancer after age 57.  Jacqueline [name]'s father is 73 and has not had cancer.  She has two paternal aunts with breast cancer before the age of 70 and one paternal aunt who passed away in her 87s and had ovarian and lung cancer.   Jacqueline [name] is unaware of previous family history of genetic testing for hereditary cancer risks. There is no reported Ashkenazi Jewish ancestry. There is no known consanguinity.  GENETIC COUNSELING ASSESSMENT: Jacqueline [name] is a 40 y.o. female with a family history of cancer which is somewhat suggestive of a hereditary cancer syndrome and predisposition to cancer given the presence of related cancers in both in maternal and paternal family. We, therefore, discussed and recommended the following at today's visit.   DISCUSSION: We discussed that 5 - 10% of cancer is hereditary, with most cases of hereditary breast cancer associated with mutations in BRCA1/2.  There are other genes that can be associated with hereditary breast and/or ovarian cancer syndromes.  These include but are not limited to PALB2, CHEK2, and ATM.  We discussed that testing is beneficial for several reasons, including knowing about other cancer risks, identifying potential screening and risk-reduction options that may be appropriate, and to understanding if other family members could be at risk for cancer and allowing them to undergo genetic testing.  We reviewed the characteristics, features and inheritance patterns of hereditary cancer syndromes.  We also discussed genetic testing, including the appropriate family members to test, the process of testing, insurance coverage and turn-around-time for results. We discussed the implications of a negative, positive, carrier and/or variant of uncertain significant result. We discussed that negative results would be uninformative given that Jacqueline [name] does not have a personal history of cancer. We recommended Jacqueline [name] pursue genetic testing for a panel that contains genes associated with breast and ovarian cancer.   The Common Hereditary Cancers + RNA Panel offered by [facility] includes sequencing, deletion/duplication, and RNA testing of the following 47 genes: APC, ATM, AXIN2, BARD1, BMPR1A, BRCA1, BRCA2, BRIP1, CDH1, CDK4*, CDKN2A (p14ARF)*, CDKN2A (p16INK4a)*, CHEK2, CTNNA1, DICER1, EPCAM (Deletion/duplication testing only), GREM1 (promoter region  deletion/duplication testing only), KIT, MEN1, MLH1, MSH2, MSH3, MSH6, MUTYH, NBN, NF1, NHTL1, PALB2, PDGFRA*, PMS2, POLD1, POLE, PTEN, RAD50, RAD51C, RAD51D, SDHB, SDHC, SDHD, SMAD4, SMARCA4. STK11, TP53, TSC1, TSC2, and VHL.  The following genes were evaluated for sequence changes only: SDHA and HOXB13 c.251G>A variant only.  RNA analysis is not performed for the * genes.    Based on Jacqueline [name]'s family history of breast and ovarian cancer, she meets medical criteria for genetic testing. Despite that she meets criteria, she may still have an out of pocket cost. We discussed that if she has an out of pocket cost for testing, the laboratory will reach out to her to discuss self-pay options and patient pay assistance programs.   We discussed that some people do not want to undergo genetic testing due to fear of genetic discrimination.  A federal law called the Genetic Information Non-Discrimination Act (GINA) of 2008 helps protect individuals against genetic discrimination based on their genetic test results.  It impacts both health insurance and employment.   With health insurance, it protects against increased premiums, being kicked off insurance or being forced to take a test in order to be insured.  For employment it protects against hiring, firing and promoting decisions based on genetic test results.  GINA does not apply to those in the [employer], those who work for companies with less than 15 employees, and new life insurance or long-term disability insurance policies.  Health status due to a cancer diagnosis is not protected under GINA.  PLAN: After considering the risks, benefits, and limitations, Jacqueline [name] provided informed consent to pursue genetic testing and the blood sample was sent to [facility] for analysis of the Common Hereditary Cancers +RNA Panel. Results should be available within approximately 3 weeks' time, at which point they will be disclosed by telephone to Jacqueline [name], as will any additional recommendations warranted by these results. Jacqueline [name] will receive a summary of her genetic counseling visit and a copy of her results once available. This information will also be available in Epic.   Based on Jacqueline [name]'s family history, we recommended her mother and paternal aunts have genetic counseling and testing. Jacqueline [name] will let us know if we can be of any assistance in coordinating genetic counseling and/or testing for these family members.   Lastly, we encouraged Jacqueline [name] to remain in contact with cancer genetics annually so that we can continuously update the family history and inform her of any changes in cancer genetics and testing that may be of benefit for this family.   Jacqueline [name]. Mcclanahan's questions were answered to her satisfaction today. Our contact information was provided should additional questions or concerns arise. Thank you for the referral and allowing [place] to share in the care of your patient.   Jonna Dittrich M. Joette Catching, [place], [facility] Genetic Counselor Artist [email or web address] (P) [phone]   The patient  was seen for a total of 40 minutes in face-to-face genetic counseling.  Drs. Magrinat, Lindi Adie and/or Burr Medico were available to discuss this case as needed.  _______________________________________________________________________ For Office Staff:  Number of people involved in session: 1 Was an Intern/ student involved with case: no

## 2021-02-21 ENCOUNTER — Encounter: Payer: Self-pay | Admitting: Genetic Counselor

## 2021-02-21 ENCOUNTER — Telehealth: Payer: Self-pay | Admitting: Genetic Counselor

## 2021-02-21 ENCOUNTER — Ambulatory Visit: Payer: Self-pay | Admitting: Genetic Counselor

## 2021-02-21 DIAGNOSIS — Z801 Family history of malignant neoplasm of trachea, bronchus and lung: Secondary | ICD-10-CM

## 2021-02-21 DIAGNOSIS — Z803 Family history of malignant neoplasm of breast: Secondary | ICD-10-CM

## 2021-02-21 DIAGNOSIS — Z1379 Encounter for other screening for genetic and chromosomal anomalies: Secondary | ICD-10-CM | POA: Insufficient documentation

## 2021-02-21 DIAGNOSIS — Z8041 Family history of malignant neoplasm of ovary: Secondary | ICD-10-CM

## 2021-02-21 NOTE — Progress Notes (Signed)
HPI:  [name] was previously seen in the [place] clinic due to a family history of cancer and concerns regarding a hereditary predisposition to cancer. Please refer to our prior cancer genetics clinic note for more information regarding our discussion, assessment and recommendations, at the time. [name] recent genetic test results were disclosed to her, as were recommendations warranted by these results. These results and recommendations are discussed in more detail below.  CANCER HISTORY:  Oncology History   No history exists.    FAMILY HISTORY:  We obtained a detailed, 4-generation family history.  Significant diagnoses are listed below: Family History  Problem Relation Age of Onset  . Breast cancer Mother 9       ER+  . Ovarian cancer Maternal Grandmother        dx 47s  . Lung cancer Maternal Grandmother        dx 64s  . Esophageal cancer Maternal Grandmother        dx after 50  . Breast cancer Paternal Aunt        dx before 43  . Breast cancer Paternal Aunt        dx before 62  . Ovarian cancer Paternal Aunt        dx 96s  . Lung cancer Paternal Aunt        dx 74s    [name] has two daughters (age 58, 52) and one son (age 24).  She has one full sister, age 110, and one paternal half brother, age 43.  [name]'s mother, age 80, was diagnosed with breast cancer at age 54 and has not had genetic testing. [name]'s maternal grandmother had ovarian cancer in her 66s and lung and esophagus cancer after age 7.  [name]'s father is 10 and has not had cancer.  She has two paternal aunts with breast cancer before the age of 54 and one paternal aunt who passed away in her 39s and had ovarian and lung cancer.   [name] is unaware of previous family history of genetic testing for hereditary cancer risks. There is no reported Ashkenazi Jewish ancestry. There is no known consanguinity.  GENETIC TEST RESULTS: Genetic testing reported out  on Feb 15, 2021.  The [facility] Common Hereditary Cancers +RNA Panel found no pathogenic mutations.  The Common Hereditary Cancers + RNA Panel offered by [facility] includes sequencing, deletion/duplication, and RNA testing of the following 47 genes: APC, ATM, AXIN2, BARD1, BMPR1A, BRCA1, BRCA2, BRIP1, CDH1, CDK4*, CDKN2A (p14ARF)*, CDKN2A (p16INK4a)*, CHEK2, CTNNA1, DICER1, EPCAM (Deletion/duplication testing only), GREM1 (promoter region deletion/duplication testing only), KIT, MEN1, MLH1, MSH2, MSH3, MSH6, MUTYH, NBN, NF1, NHTL1, PALB2, PDGFRA*, PMS2, POLD1, POLE, PTEN, RAD50, RAD51C, RAD51D, SDHB, SDHC, SDHD, SMAD4, SMARCA4. STK11, TP53, TSC1, TSC2, and VHL.  The following genes were evaluated for sequence changes only: SDHA and HOXB13 c.251G>A variant only.  RNA analysis is not performed for the * genes.    The test report has been scanned into EPIC and is located under the Molecular Pathology section of the Results Review tab.  A portion of the result report is included below for reference.     We discussed with [name] that because current genetic testing is not perfect, it is possible there may be a gene mutation in one of these genes that current testing cannot detect, but that chance is small.  We also discussed, that there could be another gene that has not yet been discovered, or that  we have not yet tested, that is responsible for the cancer diagnoses in the family. It is also possible there is a hereditary cause for the cancer in the family that [name] did not inherit and therefore was not identified in her testing.  Therefore, it is important to remain in touch with cancer genetics in the future so that we can continue to offer Ms. Osentoski the most up to date genetic testing.   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  At this time, it is unknown if this variant is associated with increased cancer risk or if  this is a normal finding, but most variants such as this get reclassified to being inconsequential. It should not be used to make medical management decisions. With time, we suspect the lab will determine the significance of this variant, if any. If we do learn more about it, we will try to contact [name] to discuss it further. However, it is important to stay in touch with [place] periodically and keep the address and phone number up to date.  ADDITIONAL GENETIC TESTING: There are other genes that are associated with increased cancer risk that can be analyzed. Should [name] wish to pursue additional genetic testing, we are happy to discuss and coordinate this testing, at any time.    CANCER SCREENING RECOMMENDATIONS: [name]'s test result is considered negative (normal).  This means that we have not identified a hereditary cause for her family history of cancer at this time.   While reassuring, this does not definitively rule out a hereditary predisposition to cancer. It is still possible that there could be genetic mutations that are undetectable by current technology. There could be genetic mutations in genes that have not been tested or identified to increase cancer risk.  Therefore, it is recommended she continue to follow the cancer management and screening guidelines provided by her primary healthcare provider.   An individual's cancer risk and medical management are not determined by genetic test results alone. Overall cancer risk assessment incorporates additional factors, including personal medical history, family history, and any available genetic information that may result in a personalized plan for cancer prevention and surveillance  Based on Ms. Hritz's family of cancer, as well as her genetic test results, statistical models [name])  and literature data were used to estimate her risk of developing breast cancer. These estimate her lifetime risk of developing breast  cancer to be approximately 19.8%.  The patient's lifetime breast cancer risk is a preliminary estimate based on available information using one of several models endorsed by the [place] (ACS). The ACS recommends consideration of breast MRI screening as an adjunct to mammography for patients at high risk (defined as 20% or greater lifetime risk).      RECOMMENDATIONS FOR FAMILY MEMBERS:  Individuals in this family might be at some increased risk of developing cancer, over the general population risk, simply due to the family history of cancer.  We recommended women in this family have a yearly mammogram beginning at age 48, or 59 years younger than the earliest onset of cancer, an annual clinical breast exam, and perform monthly breast self-exams. Women in this family should also have a gynecological exam as recommended by their primary provider. Family members should be referred for colonoscopy starting at age 9.  It is also possible there is a hereditary cause for the cancer in Ms. Luce's family that she  did not inherit and therefore was not identified in her.  Based on Ms. Fiola's family history, we recommended her mother and paternal aunts with a history of breast cancer have genetic counseling and testing. Ms. Fortenberry will let us know if we can be of any assistance in coordinating genetic counseling and/or testing for this family member.   FOLLOW-UP: Lastly, we discussed with Ms. Heming that cancer genetics is a rapidly advancing field and it is possible that new genetic tests will be appropriate for her and/or her family members in the future. We encouraged her to remain in contact with cancer genetics on an annual basis so we can update her personal and family histories and let her know of advances in cancer genetics that may benefit this family.   Our contact number was provided. Ms. Hosack's questions were answered to her satisfaction, and she knows she is welcome to  call us at anytime with additional questions or concerns.     Rasul Decola M. Joette Catching, [place], [facility] Genetic Counselor [email or web address] (P) [phone]

## 2021-02-21 NOTE — Telephone Encounter (Signed)
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  Discussed that we do not know why there is cancer in the family. It could be familial, due a change in a gene she did not inherit, due to a different gene that we are not testing, or maybe our current technology may not be able to pick something up.  It will be important for her to keep in contact with genetics to keep up with whether additional testing may be needed.

## 2021-05-16 ENCOUNTER — Other Ambulatory Visit: Payer: Self-pay | Admitting: Nurse Practitioner

## 2021-05-16 DIAGNOSIS — I10 Essential (primary) hypertension: Secondary | ICD-10-CM

## 2021-08-03 ENCOUNTER — Other Ambulatory Visit: Payer: Self-pay | Admitting: Adult Health

## 2021-08-03 MED ORDER — FLUCONAZOLE 150 MG PO TABS: ORAL_TABLET | ORAL | 1 refills | Status: DC

## 2021-08-03 MED ORDER — CLINDAMYCIN PHOSPHATE 100 MG VA SUPP: 100.0000 mg | Freq: Every day | VAGINAL | 0 refills | Status: DC

## 2021-08-03 NOTE — Progress Notes (Signed)
Will rx diflucan and cleocin

## 2022-03-01 HISTORY — PX: BREAST CYST ASPIRATION: SHX578

## 2022-04-09 ENCOUNTER — Ambulatory Visit: Payer: Medicaid Other | Admitting: Adult Health

## 2022-06-23 ENCOUNTER — Ambulatory Visit (INDEPENDENT_AMBULATORY_CARE_PROVIDER_SITE_OTHER): Payer: Medicaid Other

## 2022-06-23 ENCOUNTER — Ambulatory Visit (HOSPITAL_COMMUNITY)
Admission: EM | Admit: 2022-06-23 | Discharge: 2022-06-23 | Disposition: A | Discharge status: Home / Self Care | Payer: Medicaid Other

## 2022-06-23 ENCOUNTER — Encounter (HOSPITAL_COMMUNITY): Payer: Self-pay | Admitting: *Deleted

## 2022-06-23 DIAGNOSIS — R059 Cough, unspecified: Secondary | ICD-10-CM

## 2022-06-23 DIAGNOSIS — R0602 Shortness of breath: Secondary | ICD-10-CM

## 2022-06-23 DIAGNOSIS — B309 Viral conjunctivitis, unspecified: Secondary | ICD-10-CM | POA: Diagnosis not present

## 2022-06-23 DIAGNOSIS — J069 Acute upper respiratory infection, unspecified: Secondary | ICD-10-CM | POA: Diagnosis not present

## 2022-06-23 HISTORY — DX: COVID-19: U07.1

## 2022-06-23 MED ORDER — OLOPATADINE HCL 0.2 % OP SOLN: 1.0000 [drp] | Freq: Every day | OPHTHALMIC | 0 refills | Status: AC

## 2022-06-23 MED ORDER — GUAIFENESIN ER 600 MG PO TB12: 600.0000 mg | ORAL_TABLET | Freq: Two times a day (BID) | ORAL | 0 refills | Status: AC

## 2022-06-23 MED ORDER — OLOPATADINE HCL 0.2 % OP SOLN: 1.0000 [drp] | Freq: Two times a day (BID) | OPHTHALMIC | 0 refills | Status: DC

## 2022-06-23 MED ORDER — BENZONATATE 100 MG PO CAPS: 100.0000 mg | ORAL_CAPSULE | Freq: Three times a day (TID) | ORAL | 0 refills | Status: AC | PRN

## 2022-06-23 NOTE — ED Provider Notes (Signed)
[name]    CSN: [id number] Arrival date & time: 06/23/22  1013     History   Chief Complaint Chief Complaint  Patient presents with   Cough   Eye Problem    HPI Jacqueline [name] is a 41 y.o. female.  Presents with cough that began last night Reports productive, hacking, causing her to throw up No fever, chest pain, or shortness of breath at rest Feels winded after ambulating  Additionally 4 days ago, left eye became red and irritated Some crusting in the morning, clear drainage No vision changes  Had covid 2 weeks ago but didn't have cough with it Reports multiple sick contacts  No history of asthma or COPD. Quit smoking 2 months ago  Past Medical History:  Diagnosis Date   Anal wart 10/27/2015   Back pain 05/09/2015   BV (bacterial vaginosis) 10/27/2015   Chlamydia infection 11/05/2016   Constipated 09/16/2014   Constipation 08/02/2015   Contraceptive management 12/20/2014   COVID-19    Family history of breast cancer 01/31/2021   Family history of lung cancer 01/31/2021   FH: ovarian cancer 01/31/2021   Hemorrhoids 08/02/2015   Herpes infection 03/10/2015   Herpes simplex without mention of complication    LLQ pain 08/02/2015   Neuromuscular disorder ([place])    Nerve damage in back and arm   Pelvic congestion syndrome 09/06/2015   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   Vaginal discharge 10/27/2015   Vaginal irritation 09/16/2014   Vaginal odor 10/27/2015    Patient Active Problem List   Diagnosis Date Noted   Genetic testing 02/21/2021   Family history of breast cancer 01/31/2021   FH: ovarian cancer 01/31/2021   Family history of lung cancer 01/31/2021   Anogenital warts in female 08/19/2018   Chlamydia 11/05/2016   Pelvic congestion syndrome 09/06/2015   LLQ pain 08/02/2015   Constipation 08/02/2015   Hemorrhoids 08/02/2015   Back pain 05/09/2015   Constipated 09/16/2014   Abdominal pain  12/27/2013   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   Ovulation pain 11/13/2013    Past Surgical History:  Procedure Laterality Date   CHOLECYSTECTOMY N/A 12/30/2013   Procedure: LAPAROSCOPIC CHOLECYSTECTOMY;  Surgeon: Jamesetta So, MD;  Location: [facility];  Service: General;  Laterality: N/A;   ENDOSCOPIC RETROGRADE CHOLANGIOPANCREATOGRAPHY (ERCP) WITH PROPOFOL  10/01/2013   ERCP N/A 12/29/2013   Procedure: ENDOSCOPIC RETROGRADE CHOLANGIOPANCREATOGRAPHY (ERCP);  Surgeon: Daneil Dolin, MD;  Location: [facility];  Service: Endoscopy;  Laterality: N/A;  and stone extraction   SPHINCTEROTOMY N/A 12/29/2013   Procedure: SPHINCTEROTOMY;  Surgeon: Daneil Dolin, MD;  Location: [facility];  Service: Endoscopy;  Laterality: N/A;    OB History     Gravida  3   Para  3   Term  3   Preterm      AB      Living  3      SAB      IAB      Ectopic      Multiple      Live Births  3            Home Medications    Prior to Admission medications   Medication Sig Start Date End Date Taking? Authorizing Provider  Ascorbic Acid (VITAMIN C PO) Take by mouth.   Yes [provider]  benzonatate (TESSALON) 100 MG capsule Take 1 capsule (100 mg total) by mouth 3 (three) times daily as  needed for up to 5 days for cough. 06/23/22 06/28/22 Yes Sudiksha Victor, PA-C  Cyanocobalamin (VITAMIN B-12 PO) Take by mouth.   Yes [provider]  Ferrous Sulfate (IRON PO) Take by mouth.   Yes [provider]  guaiFENesin (MUCINEX) 600 MG 12 hr tablet Take 1 tablet (600 mg total) by mouth 2 (two) times daily for 5 days. 06/23/22 06/28/22 Yes Kydan Shanholtzer, Wells Guiles, PA-C  Sertraline HCl (ZOLOFT PO) Take by mouth.   Yes [provider]  Olopatadine HCl 0.2 % SOLN Apply 1 drop to eye daily for 5 days. 06/23/22 06/28/22  Latish Toutant, Wells Guiles, PA-C    Family History Family History  Problem Relation Age of Onset   Hypertension Mother    Breast cancer Mother 26       ER+   COPD Maternal  Grandmother    Diabetes Maternal Grandmother    Ovarian cancer Maternal Grandmother        dx 61s   Lung cancer Maternal Grandmother        dx 45s   Esophageal cancer Maternal Grandmother        dx after 93   Hypertension Sister    Breast cancer Paternal Aunt        dx before 52   Breast cancer Paternal Aunt        dx before 64   Ovarian cancer Paternal Aunt        dx 59s   Lung cancer Paternal Aunt        dx 72s   Colon cancer Neg Hx     Social History Social History   Tobacco Use   Smoking status: Former    Types: Cigars    Quit date: 09/30/2012    Years since quitting: 9.7   Smokeless tobacco: Never  Vaping Use   Vaping Use: Never used  Substance Use Topics   Alcohol use: Yes    Comment: occasional wine   Drug use: No     Allergies   Penicillin g, Penicillins, and Oxycodone-acetaminophen   Review of Systems Review of Systems  Respiratory:  Positive for cough.    Per HPI  Physical Exam Triage Vital Signs ED Triage Vitals  Enc Vitals Group     BP 06/23/22 1111 (!) 142/84     Pulse Rate 06/23/22 1111 86     Resp 06/23/22 1111 (!) 28     Temp 06/23/22 1111 98.8 F (37.1 C)     Temp Source 06/23/22 1111 Oral     SpO2 06/23/22 1111 96 %     Weight --      Height --      Head Circumference --      Peak Flow --      Pain Score 06/23/22 1113 9     Pain Loc --      Pain Edu? --      Excl. in [place]? --    No data found.  Updated Vital Signs BP (!) 142/84   Pulse 86   Temp 98.8 F (37.1 C) (Oral)   Resp (!) 28   LMP  (LMP Unknown)   SpO2 96%   Visual Acuity Right Eye Distance: 20/20 -1 Left Eye Distance: 20/15 Bilateral Distance: 20/15  Right Eye Near:   Left Eye Near:    Bilateral Near:     Physical Exam Vitals and nursing note reviewed.  Constitutional:      General: She is not in acute distress.    Appearance:  She is not ill-appearing.  HENT:     Mouth/Throat:     Mouth: Mucous membranes are moist.     Pharynx: Uvula midline. No  posterior oropharyngeal erythema.     Tonsils: No tonsillar exudate or tonsillar abscesses.  Eyes:     General:        Left eye: No foreign body or discharge.     Conjunctiva/sclera:     Left eye: Left conjunctiva is injected.     Pupils: Pupils are equal, round, and reactive to light.     Comments: Clear tearing, mildly injected  Cardiovascular:     Rate and Rhythm: Normal rate and regular rhythm.     Heart sounds: Normal heart sounds.  Pulmonary:     Effort: Pulmonary effort is normal. No respiratory distress.     Breath sounds: Normal breath sounds.     Comments: Very faint wheezing upper lobes Musculoskeletal:     Cervical back: Normal range of motion.  Lymphadenopathy:     Cervical: No cervical adenopathy.  Neurological:     Mental Status: She is alert and oriented to person, place, and time.     UC Treatments / Results  Labs (all labs ordered are listed, but only abnormal results are displayed) Labs Reviewed - No data to display  EKG  Radiology DG Chest 2 View  Result Date: 06/23/2022 CLINICAL DATA:  Cough and shortness of breath for 2 days. Recent COVID infection. EXAM: CHEST - 2 VIEW COMPARISON:  09/14/2014 FINDINGS: The heart size and mediastinal contours are within normal limits. Both lungs are clear. The visualized skeletal structures are unremarkable. IMPRESSION: No active cardiopulmonary disease. Electronically Signed   By: Marlaine Hind M.D.   On: 06/23/2022 12:09    Procedures Procedures  Medications Ordered in UC Medications - No data to display  Initial Impression / Assessment and Plan / UC Course  I have reviewed the triage vital signs and the nursing notes.  Pertinent labs & imaging results that were available during my care of the patient were reviewed by me and considered in my medical decision making (see chart for details).  Patient denies possibility of pregnancy, declines pregnancy test  Chest xray negative. Likely viral etiology especially  with multiple sick contacts Sent tessalon to try, recommend mucinex if needed for productive cough Allergy eye drops for the left eye, warm compress as needed Discussed return precautions, patient agrees to plan  Final Clinical Impressions(s) / UC Diagnoses   Final diagnoses:  Viral conjunctivitis of left eye  Viral URI with cough     Discharge Instructions      Your xray was negative.  You likely have a virus causing your symptoms.  I recommend using the tessalon (cough medicine) up to three times daily.  You can use the mucinex every 12 hours to help loosen phlegm.  You can use the eye drops once daily in the left eye to help with irritation.  Please return if symptoms persist despite medication use over the next 5-7 days.  Please go to the emergency department if symptoms worsen, especially if you develop trouble breathing.     ED Prescriptions     Medication Sig Dispense Auth. Provider   benzonatate (TESSALON) 100 MG capsule Take 1 capsule (100 mg total) by mouth 3 (three) times daily as needed for up to 5 days for cough. 15 capsule Eulia Hatcher, PA-C   guaiFENesin (MUCINEX) 600 MG 12 hr tablet Take 1 tablet (600 mg total) by  mouth 2 (two) times daily for 5 days. 10 tablet Jyll Tomaro, PA-C   Olopatadine HCl 0.2 % SOLN  (Status: Discontinued) Apply 1 drop to eye in the morning and at bedtime for 5 days. 2.5 mL Denyla Cortese, PA-C   Olopatadine HCl 0.2 % SOLN Apply 1 drop to eye daily for 5 days. 2.5 mL Aloysuis Ribaudo, Wells Guiles, PA-C      PDMP not reviewed this encounter.   Kelwin Gibler, Vernice Jefferson 06/23/22 1228

## 2022-06-23 NOTE — ED Triage Notes (Signed)
Reports having had Covid 2-3 wks ago; states started with cough 2 days ago with worsening last night. Reports having coughing fits causing her to spit up. Pt appears to become winded after ambulating. Started with left eye irritation, redness, and crusting onset 4 days ago.

## 2022-06-23 NOTE — Discharge Instructions (Addendum)
Your xray was negative.  You likely have a virus causing your symptoms.  I recommend using the tessalon (cough medicine) up to three times daily.  You can use the mucinex every 12 hours to help loosen phlegm.  You can use the eye drops once daily in the left eye to help with irritation.  Please return if symptoms persist despite medication use over the next 5-7 days.  Please go to the emergency department if symptoms worsen, especially if you develop trouble breathing.

## 2022-07-02 ENCOUNTER — Other Ambulatory Visit: Payer: Self-pay

## 2022-07-02 ENCOUNTER — Ambulatory Visit (HOSPITAL_COMMUNITY)
Admission: EM | Admit: 2022-07-02 | Discharge: 2022-07-02 | Disposition: A | Discharge status: Home / Self Care | Payer: Medicaid Other | Attending: Family Medicine | Admitting: Family Medicine

## 2022-07-02 ENCOUNTER — Encounter (HOSPITAL_COMMUNITY): Payer: Self-pay | Admitting: *Deleted

## 2022-07-02 DIAGNOSIS — H109 Unspecified conjunctivitis: Secondary | ICD-10-CM

## 2022-07-02 DIAGNOSIS — H5712 Ocular pain, left eye: Secondary | ICD-10-CM

## 2022-07-02 MED ORDER — MOXIFLOXACIN HCL 0.5 % OP SOLN: 1.0000 [drp] | Freq: Three times a day (TID) | OPHTHALMIC | 0 refills | Status: AC

## 2022-07-02 NOTE — ED Triage Notes (Signed)
Pt reports Lt eye drainage has not gotten better. Pt has been using RX that was prescribed with out improvement. Pt also has swelling to upper and lower lid on Lt.

## 2022-07-02 NOTE — ED Provider Notes (Signed)
[place]    CSN: [id number] Arrival date & time: 07/02/22  0801      History   Chief Complaint Chief Complaint  Patient presents with   Eye Problem    HPI Jacqueline [name] is a 41 y.o. female.   Patient is here for continued left eye issues.  Was seen last week, given allergy eye drops, and eye is no better.  Eye is red, white, cloudy drainage, sometimes is green.  Some pain under the eye, but thinks that may be due to rubbing it.  Some blurry vision.  No issues with the right eye.  No glasses or contacts.   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   Vaginal discharge 10/27/2015   Vaginal irritation 09/16/2014   Vaginal odor 10/27/2015    Patient Active Problem List   Diagnosis Date Noted   Genetic testing 02/21/2021   Family history of breast cancer 01/31/2021   FH: ovarian cancer 01/31/2021   Family history of lung cancer 01/31/2021   Anogenital warts in female 08/19/2018   Chlamydia 11/05/2016   Pelvic congestion syndrome 09/06/2015   LLQ pain 08/02/2015   Constipation 08/02/2015   Hemorrhoids 08/02/2015   Back pain 05/09/2015   Constipated 09/16/2014   Abdominal pain 12/27/2013   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   Ovulation pain 11/13/2013    Past Surgical History:  Procedure Laterality Date   CHOLECYSTECTOMY  N/A 12/30/2013   Procedure: LAPAROSCOPIC CHOLECYSTECTOMY;  Surgeon: Jamesetta So, MD;  Location: [facility];  Service: General;  Laterality: N/A;   ENDOSCOPIC RETROGRADE CHOLANGIOPANCREATOGRAPHY (ERCP) WITH PROPOFOL  10/01/2013   ERCP N/A 12/29/2013   Procedure: ENDOSCOPIC RETROGRADE CHOLANGIOPANCREATOGRAPHY (ERCP);  Surgeon: Daneil Dolin, MD;  Location: [facility];  Service: Endoscopy;  Laterality: N/A;  and stone extraction   SPHINCTEROTOMY N/A 12/29/2013   Procedure: SPHINCTEROTOMY;  Surgeon: Daneil Dolin, MD;  Location: [facility];  Service: Endoscopy;  Laterality: N/A;    OB History     Gravida  3   Para  3   Term  3   Preterm      AB      Living  3      SAB      IAB      Ectopic      Multiple      Live Births  3            Home Medications    Prior to Admission medications   Medication Sig Start Date End Date Taking? Authorizing Provider  Ascorbic Acid (VITAMIN C PO) Take by mouth.    [provider]  Cyanocobalamin (VITAMIN B-12 PO) Take by mouth.    [provider]  Ferrous Sulfate (IRON PO) Take by mouth.    [provider]  Sertraline HCl (ZOLOFT PO) Take  by mouth.    [provider]    Family History Family History  Problem Relation Age of Onset   Hypertension Mother    Breast cancer Mother 8       ER+   COPD Maternal Grandmother    Diabetes Maternal Grandmother    Ovarian cancer Maternal Grandmother        dx 77s   Lung cancer Maternal Grandmother        dx 61s   Esophageal cancer Maternal Grandmother        dx after 50   Hypertension Sister    Breast cancer Paternal Aunt        dx before 64   Breast cancer Paternal Aunt        dx before 74   Ovarian cancer Paternal Aunt        dx 6s   Lung cancer Paternal Aunt        dx 53s   Colon cancer Neg Hx     Social History Social History   Tobacco Use   Smoking status: Former    Types: Cigars    Quit date: 09/30/2012    Years since quitting: 9.7    Smokeless tobacco: Never  Vaping Use   Vaping Use: Never used  Substance Use Topics   Alcohol use: Yes    Comment: occasional wine   Drug use: No     Allergies   Penicillin g, Penicillins, and Oxycodone-acetaminophen   Review of Systems Review of Systems  Constitutional: Negative.   HENT: Negative.    Eyes:  Positive for pain, discharge, redness and visual disturbance.  Respiratory: Negative.    Cardiovascular: Negative.   Gastrointestinal: Negative.   Genitourinary: Negative.   Musculoskeletal: Negative.      Physical Exam Triage Vital Signs ED Triage Vitals  Enc Vitals Group     BP 07/02/22 0819 132/88     Pulse Rate 07/02/22 0819 66     Resp 07/02/22 0819 18     Temp 07/02/22 0819 98.6 F (37 C)     Temp src --      SpO2 07/02/22 0819 96 %     Weight --      Height --      Head Circumference --      Peak Flow --      Pain Score 07/02/22 0816 8     Pain Loc --      Pain Edu? --      Excl. in [place]? --    No data found.  Updated Vital Signs BP 132/88   Pulse 66   Temp 98.6 F (37 C)   Resp 18   LMP  (LMP Unknown)   SpO2 96%   Visual Acuity Right Eye Distance:   Left Eye Distance:   Bilateral Distance:    Right Eye Near:   Left Eye Near:    Bilateral Near:     Physical Exam Constitutional:      Appearance: Normal appearance.  HENT:     Head: Normocephalic.  Eyes:     General: Lids are normal.     Extraocular Movements: Extraocular movements intact.     Conjunctiva/sclera:     Left eye: Left conjunctiva is injected. Hemorrhage present.     Comments: + light sensative on the left  Cardiovascular:     Rate and Rhythm: Normal rate.  Pulmonary:     Effort: Pulmonary effort is normal.  Neurological:  General: No focal deficit present.     Mental Status: She is alert.  Psychiatric:        Mood and Affect: Mood normal.      UC Treatments / Results  Labs (all labs ordered are listed, but only abnormal results are displayed) Labs  Reviewed - No data to display  EKG   Radiology No results found.  Procedures Procedures (including critical care time)  Medications Ordered in UC Medications - No data to display  Initial Impression / Assessment and Plan / UC Course  I have reviewed the triage vital signs and the nursing notes.  Pertinent labs & imaging results that were available during my care of the patient were reviewed by me and considered in my medical decision making (see chart for details).  Patient here today for pain, redness and d/c from the left eye;  Will treat today for bacterial infection as allergy eye drops not helpful.  However, I do recommend she be seen by an eye specialist if not improving in the next 24 -48 hrs.  She is aware and agrees.    Final Clinical Impressions(s) / UC Diagnoses   Final diagnoses:  Bacterial conjunctivitis  Pain of left eye     Discharge Instructions      You were seen today for eye redness, drainage and pain.  I have sent out an antibiotic eye drop for you today.  However, if you are not improving in the next 24-48 hrs, then please make an appointment with an eye specialist for further evaluation.     ED Prescriptions     Medication Sig Dispense Auth. Provider   moxifloxacin (VIGAMOX) 0.5 % ophthalmic solution Place 1 drop into the left eye 3 (three) times daily. 3 mL Jannifer Franklin, MD      PDMP not reviewed this encounter.   Jannifer Franklin, MD 07/02/22 [phone]

## 2022-07-02 NOTE — Discharge Instructions (Signed)
You were seen today for eye redness, drainage and pain.  I have sent out an antibiotic eye drop for you today.  However, if you are not improving in the next 24-48 hrs, then please make an appointment with an eye specialist for further evaluation.

## 2022-10-08 DIAGNOSIS — R059 Cough, unspecified: Secondary | ICD-10-CM | POA: Diagnosis not present

## 2022-10-08 DIAGNOSIS — R079 Chest pain, unspecified: Secondary | ICD-10-CM | POA: Diagnosis not present

## 2022-10-08 DIAGNOSIS — I493 Ventricular premature depolarization: Secondary | ICD-10-CM | POA: Diagnosis not present

## 2023-02-05 DIAGNOSIS — Z0189 Encounter for other specified special examinations: Secondary | ICD-10-CM | POA: Diagnosis not present

## 2023-03-28 ENCOUNTER — Ambulatory Visit (INDEPENDENT_AMBULATORY_CARE_PROVIDER_SITE_OTHER): Payer: Federal, State, Local not specified - PPO | Admitting: Adult Health

## 2023-03-28 ENCOUNTER — Other Ambulatory Visit (HOSPITAL_COMMUNITY)
Admission: RE | Admit: 2023-03-28 | Discharge: 2023-03-28 | Disposition: A | Discharge status: Home / Self Care | Payer: Federal, State, Local not specified - PPO | Source: Ambulatory Visit | Attending: Adult Health | Admitting: Adult Health

## 2023-03-28 ENCOUNTER — Encounter: Payer: Self-pay | Admitting: Adult Health

## 2023-03-28 VITALS — BP 131/76 | HR 75 | Ht 72.0 in | Wt 243.0 lb

## 2023-03-28 DIAGNOSIS — F32A Depression, unspecified: Secondary | ICD-10-CM

## 2023-03-28 DIAGNOSIS — R232 Flushing: Secondary | ICD-10-CM | POA: Diagnosis not present

## 2023-03-28 DIAGNOSIS — Z01419 Encounter for gynecological examination (general) (routine) without abnormal findings: Secondary | ICD-10-CM | POA: Insufficient documentation

## 2023-03-28 DIAGNOSIS — Z1211 Encounter for screening for malignant neoplasm of colon: Secondary | ICD-10-CM | POA: Diagnosis not present

## 2023-03-28 DIAGNOSIS — N926 Irregular menstruation, unspecified: Secondary | ICD-10-CM | POA: Diagnosis not present

## 2023-03-28 DIAGNOSIS — F419 Anxiety disorder, unspecified: Secondary | ICD-10-CM | POA: Insufficient documentation

## 2023-03-28 LAB — HEMOCCULT GUIAC POC 1CARD (OFFICE): Fecal Occult Blood, POC: NEGATIVE

## 2023-03-28 NOTE — Progress Notes (Signed)
Patient ID: Jacqueline [name], female   DOB: 03-14-1981, 42 y.o.   MRN: [id number] History of Present Illness: Jacqueline [name] is a 42 year old black female,single, [id number], in for a well woman gyn exam and pap. She is having hot flashes and has missed periods for about a year now. She works for [employer] now.  PCP is [place] [facility]    Current Medications, Allergies, Past Medical History, Past Surgical History, Family History and Social History were reviewed in [employer] record.     Review of Systems: Patient denies any headaches, hearing loss, fatigue, blurred vision, shortness of breath, chest pain, abdominal pain, problems with bowel movements, urination, or intercourse(not active). No joint pain or mood swings.  See HPI for positives.   Physical Exam:BP 131/76 (BP Location: Left Arm, Patient Position: Sitting, Cuff Size: Normal)   Pulse 75   Ht 6' (1.829 m)   Wt 243 lb (110.2 kg)   BMI 32.96 kg/m   General:  Well developed, well nourished, no acute distress Skin:  Warm and dry Neck:  Midline trachea, normal thyroid, good ROM, no lymphadenopathy Lungs; Clear to auscultation bilaterally Breast:  No dominant palpable mass, retraction, or nipple discharge Cardiovascular: Regular rate and rhythm Abdomen:  Soft, non tender, no hepatosplenomegaly Pelvic:  External genitalia is normal in appearance, no lesions.  The vagina is normal in appearance. Urethra has no lesions or masses. The cervix is bulbous.Pap with HR HPV genotyping, and GC/CHL performed  Uterus is felt to be normal size, shape, and contour.  No adnexal masses or tenderness noted.Bladder is non tender, no masses felt. Rectal: Good sphincter tone, no polyps, or hemorrhoids felt.  Hemoccult negative. Extremities/musculoskeletal:  No swelling or varicosities noted, no clubbing or cyanosis Psych:  No mood changes, alert and cooperative,seems happy AA is 1 Fall risk is low    03/28/2023    1:33 PM 08/19/2018     3:04 PM 06/12/2017    9:23 AM  Depression screen PHQ 2/9  Decreased Interest 1 0 0  Down, Depressed, Hopeless 1 0 0  PHQ - 2 Score 2 0 0  Altered sleeping 3    Tired, decreased energy 3    Change in appetite 2    Feeling bad or failure about yourself  1    Trouble concentrating 2    Moving slowly or fidgety/restless 2    Suicidal thoughts 0    PHQ-9 Score 15     Stressed at work, declines meds    03/28/2023    1:34 PM  GAD 7 : Generalized Anxiety Score  Nervous, Anxious, on Edge 3  Control/stop worrying 2  Worry too much - different things 3  Trouble relaxing 3  Restless 2  Easily annoyed or irritable 3  Afraid - awful might happen 1  Total GAD 7 Score 17      Upstream - 03/28/23 1334       Pregnancy Intention Screening   Does the patient want to become pregnant in the next year? No    Does the patient's partner want to become pregnant in the next year? No    Would the patient like to discuss contraceptive options today? No      Contraception Wrap Up   Current Method Abstinence    End Method Abstinence    Contraception Counseling Provided No            Examination chaperoned by Faith Rogue LPN  Impression and Plan: 1. Encounter for  gynecological examination with Papanicolaou smear of cervix Pap sent Pap in 3 years if normal Physical in 1 year Labs with [facility] - Cytology - PAP( [place])  2. Encounter for screening fecal occult blood testing Hemoccult was negative  - POCT occult blood stool  3. Hot flashes +hot flashes Will check FSH - Follicle stimulating hormone  4. Missed periods No period in almost a year Will check FSH, will message when results back  - Follicle stimulating hormone  5. Anxiety and depression Stress at work Declines meds

## 2023-03-29 LAB — FOLLICLE STIMULATING HORMONE: FSH: 9.4 m[IU]/mL

## 2023-04-01 ENCOUNTER — Other Ambulatory Visit: Payer: Self-pay | Admitting: Adult Health

## 2023-04-01 LAB — CYTOLOGY - PAP
Chlamydia: NEGATIVE
Comment: NEGATIVE
Comment: NEGATIVE
Comment: NORMAL
Diagnosis: NEGATIVE
High risk HPV: NEGATIVE
Neisseria Gonorrhea: NEGATIVE

## 2023-04-01 MED ORDER — MEDROXYPROGESTERONE ACETATE 10 MG PO TABS: ORAL_TABLET | ORAL | 0 refills | Status: AC

## 2023-05-02 DIAGNOSIS — F411 Generalized anxiety disorder: Secondary | ICD-10-CM | POA: Diagnosis not present

## 2023-05-02 DIAGNOSIS — F32A Depression, unspecified: Secondary | ICD-10-CM | POA: Diagnosis not present

## 2023-05-09 DIAGNOSIS — F411 Generalized anxiety disorder: Secondary | ICD-10-CM | POA: Diagnosis not present

## 2023-05-09 DIAGNOSIS — F32A Depression, unspecified: Secondary | ICD-10-CM | POA: Diagnosis not present

## 2023-05-11 DIAGNOSIS — R519 Headache, unspecified: Secondary | ICD-10-CM | POA: Diagnosis not present

## 2023-05-23 DIAGNOSIS — F32A Depression, unspecified: Secondary | ICD-10-CM | POA: Diagnosis not present

## 2023-05-23 DIAGNOSIS — H04123 Dry eye syndrome of bilateral lacrimal glands: Secondary | ICD-10-CM | POA: Diagnosis not present

## 2023-05-23 DIAGNOSIS — H527 Unspecified disorder of refraction: Secondary | ICD-10-CM | POA: Diagnosis not present

## 2023-05-23 DIAGNOSIS — G43009 Migraine without aura, not intractable, without status migrainosus: Secondary | ICD-10-CM | POA: Diagnosis not present

## 2023-05-23 DIAGNOSIS — F411 Generalized anxiety disorder: Secondary | ICD-10-CM | POA: Diagnosis not present

## 2023-06-06 DIAGNOSIS — H527 Unspecified disorder of refraction: Secondary | ICD-10-CM | POA: Diagnosis not present

## 2023-06-06 DIAGNOSIS — F411 Generalized anxiety disorder: Secondary | ICD-10-CM | POA: Diagnosis not present

## 2023-06-06 DIAGNOSIS — F32A Depression, unspecified: Secondary | ICD-10-CM | POA: Diagnosis not present

## 2023-06-27 DIAGNOSIS — F32A Depression, unspecified: Secondary | ICD-10-CM | POA: Diagnosis not present

## 2023-06-27 DIAGNOSIS — F411 Generalized anxiety disorder: Secondary | ICD-10-CM | POA: Diagnosis not present

## 2023-07-11 DIAGNOSIS — F32A Depression, unspecified: Secondary | ICD-10-CM | POA: Diagnosis not present

## 2023-07-11 DIAGNOSIS — F411 Generalized anxiety disorder: Secondary | ICD-10-CM | POA: Diagnosis not present

## 2023-07-25 DIAGNOSIS — F411 Generalized anxiety disorder: Secondary | ICD-10-CM | POA: Diagnosis not present

## 2023-07-25 DIAGNOSIS — F32A Depression, unspecified: Secondary | ICD-10-CM | POA: Diagnosis not present

## 2023-08-08 DIAGNOSIS — F411 Generalized anxiety disorder: Secondary | ICD-10-CM | POA: Diagnosis not present

## 2023-08-08 DIAGNOSIS — F32A Depression, unspecified: Secondary | ICD-10-CM | POA: Diagnosis not present

## 2023-08-13 DIAGNOSIS — N6310 Unspecified lump in the right breast, unspecified quadrant: Secondary | ICD-10-CM | POA: Diagnosis not present

## 2023-08-15 DIAGNOSIS — G894 Chronic pain syndrome: Secondary | ICD-10-CM | POA: Diagnosis not present

## 2023-08-15 DIAGNOSIS — R519 Headache, unspecified: Secondary | ICD-10-CM | POA: Diagnosis not present

## 2023-08-15 DIAGNOSIS — R262 Difficulty in walking, not elsewhere classified: Secondary | ICD-10-CM | POA: Diagnosis not present

## 2023-08-15 DIAGNOSIS — N9489 Other specified conditions associated with female genital organs and menstrual cycle: Secondary | ICD-10-CM | POA: Diagnosis not present

## 2023-08-15 DIAGNOSIS — R7303 Prediabetes: Secondary | ICD-10-CM | POA: Diagnosis not present

## 2023-08-23 ENCOUNTER — Other Ambulatory Visit (HOSPITAL_COMMUNITY): Payer: Self-pay | Admitting: Family

## 2023-08-23 DIAGNOSIS — Z1231 Encounter for screening mammogram for malignant neoplasm of breast: Secondary | ICD-10-CM

## 2023-09-04 ENCOUNTER — Encounter (HOSPITAL_COMMUNITY): Payer: Self-pay

## 2023-09-04 ENCOUNTER — Ambulatory Visit (HOSPITAL_COMMUNITY)
Admission: RE | Admit: 2023-09-04 | Discharge: 2023-09-04 | Disposition: A | Discharge status: Home / Self Care | Payer: Federal, State, Local not specified - PPO | Source: Ambulatory Visit | Attending: Family | Admitting: Family

## 2023-09-04 ENCOUNTER — Other Ambulatory Visit (HOSPITAL_COMMUNITY): Payer: Self-pay | Admitting: Family

## 2023-09-04 DIAGNOSIS — N63 Unspecified lump in unspecified breast: Secondary | ICD-10-CM

## 2023-09-04 DIAGNOSIS — Z1231 Encounter for screening mammogram for malignant neoplasm of breast: Secondary | ICD-10-CM | POA: Insufficient documentation

## 2023-09-05 DIAGNOSIS — F32A Depression, unspecified: Secondary | ICD-10-CM | POA: Diagnosis not present

## 2023-09-05 DIAGNOSIS — F411 Generalized anxiety disorder: Secondary | ICD-10-CM | POA: Diagnosis not present

## 2023-09-10 ENCOUNTER — Other Ambulatory Visit (HOSPITAL_COMMUNITY): Payer: Self-pay | Admitting: Internal Medicine

## 2023-09-10 DIAGNOSIS — R519 Headache, unspecified: Secondary | ICD-10-CM

## 2023-09-22 ENCOUNTER — Ambulatory Visit (HOSPITAL_COMMUNITY)
Admission: RE | Admit: 2023-09-22 | Discharge: 2023-09-22 | Disposition: A | Discharge status: Home / Self Care | Payer: No Typology Code available for payment source | Source: Ambulatory Visit | Attending: Internal Medicine | Admitting: Internal Medicine

## 2023-09-22 DIAGNOSIS — R519 Headache, unspecified: Secondary | ICD-10-CM | POA: Insufficient documentation

## 2023-09-30 DIAGNOSIS — F32A Depression, unspecified: Secondary | ICD-10-CM | POA: Diagnosis not present

## 2023-09-30 DIAGNOSIS — F411 Generalized anxiety disorder: Secondary | ICD-10-CM | POA: Diagnosis not present

## 2023-10-08 ENCOUNTER — Ambulatory Visit (HOSPITAL_COMMUNITY)
Admission: RE | Admit: 2023-10-08 | Discharge: 2023-10-08 | Disposition: A | Discharge status: Home / Self Care | Payer: No Typology Code available for payment source | Source: Ambulatory Visit | Attending: Family | Admitting: Family

## 2023-10-08 ENCOUNTER — Other Ambulatory Visit (HOSPITAL_COMMUNITY): Payer: Self-pay | Admitting: Family

## 2023-10-08 ENCOUNTER — Encounter (HOSPITAL_COMMUNITY): Payer: Self-pay

## 2023-10-08 DIAGNOSIS — N63 Unspecified lump in unspecified breast: Secondary | ICD-10-CM | POA: Insufficient documentation

## 2023-10-08 DIAGNOSIS — R928 Other abnormal and inconclusive findings on diagnostic imaging of breast: Secondary | ICD-10-CM

## 2023-10-17 DIAGNOSIS — F32A Depression, unspecified: Secondary | ICD-10-CM | POA: Diagnosis not present

## 2023-10-17 DIAGNOSIS — F411 Generalized anxiety disorder: Secondary | ICD-10-CM | POA: Diagnosis not present

## 2023-10-21 ENCOUNTER — Other Ambulatory Visit (HOSPITAL_COMMUNITY): Payer: Self-pay | Admitting: Family

## 2023-10-21 DIAGNOSIS — R928 Other abnormal and inconclusive findings on diagnostic imaging of breast: Secondary | ICD-10-CM

## 2023-10-22 ENCOUNTER — Ambulatory Visit (HOSPITAL_COMMUNITY)
Admission: RE | Admit: 2023-10-22 | Discharge: 2023-10-22 | Disposition: A | Discharge status: Home / Self Care | Payer: No Typology Code available for payment source | Source: Ambulatory Visit | Attending: Family | Admitting: Family

## 2023-10-22 ENCOUNTER — Encounter (HOSPITAL_COMMUNITY): Payer: Self-pay

## 2023-10-22 DIAGNOSIS — R928 Other abnormal and inconclusive findings on diagnostic imaging of breast: Secondary | ICD-10-CM | POA: Insufficient documentation

## 2023-10-22 HISTORY — PX: BREAST BIOPSY: SHX20

## 2023-10-22 MED ORDER — LIDOCAINE HCL (PF) 2 % IJ SOLN
INTRAMUSCULAR | Status: AC
  Filled 2023-10-22: qty 10

## 2023-10-22 MED ORDER — LIDOCAINE HCL (PF) 2 % IJ SOLN
10.0000 mL | Freq: Once | INTRAMUSCULAR | Status: AC
  Administered 2023-10-22: 10 mL via INTRADERMAL

## 2023-10-22 MED ORDER — LIDOCAINE-EPINEPHRINE (PF) 1 %-1:200000 IJ SOLN
INTRAMUSCULAR | Status: AC
  Filled 2023-10-22: qty 30

## 2023-10-22 NOTE — Progress Notes (Signed)
Pt escorted to [place] [facility], procedure for right breast biopsy explained, consent obtained. Local anesthetic admin without adverse reaction. US imaging obtained. Access obtained, and samples acquired without complication. Clip insertion successful. Access withdrawn, bandage placed. Escorted to radiology waiting for DC.

## 2023-10-23 LAB — SURGICAL PATHOLOGY

## 2023-10-31 DIAGNOSIS — F411 Generalized anxiety disorder: Secondary | ICD-10-CM | POA: Diagnosis not present

## 2023-10-31 DIAGNOSIS — F32A Depression, unspecified: Secondary | ICD-10-CM | POA: Diagnosis not present

## 2023-11-21 DIAGNOSIS — F411 Generalized anxiety disorder: Secondary | ICD-10-CM | POA: Diagnosis not present

## 2023-11-21 DIAGNOSIS — F32A Depression, unspecified: Secondary | ICD-10-CM | POA: Diagnosis not present

## 2023-12-12 DIAGNOSIS — F32A Depression, unspecified: Secondary | ICD-10-CM | POA: Diagnosis not present

## 2023-12-12 DIAGNOSIS — F411 Generalized anxiety disorder: Secondary | ICD-10-CM | POA: Diagnosis not present

## 2024-05-28 DIAGNOSIS — G43709 Chronic migraine without aura, not intractable, without status migrainosus: Secondary | ICD-10-CM | POA: Diagnosis not present

## 2024-09-16 ENCOUNTER — Ambulatory Visit

## 2024-09-16 ENCOUNTER — Other Ambulatory Visit (HOSPITAL_COMMUNITY)
Admission: RE | Admit: 2024-09-16 | Discharge: 2024-09-16 | Disposition: A | Discharge status: Home / Self Care | Source: Ambulatory Visit | Attending: Obstetrics & Gynecology | Admitting: Obstetrics & Gynecology

## 2024-09-16 DIAGNOSIS — Z113 Encounter for screening for infections with a predominantly sexual mode of transmission: Secondary | ICD-10-CM | POA: Insufficient documentation

## 2024-09-16 NOTE — Progress Notes (Signed)
° °  NURSE VISIT- VAGINITIS/STD/POC  SUBJECTIVE:  Jacqueline [name] is a 43 y.o. [id number] GYN patientfemale here for a vaginal swab for STD screen.  She reports the following symptoms: none . Denies abnormal vaginal bleeding, significant pelvic pain, fever, or UTI symptoms.  OBJECTIVE:  There were no vitals taken for this visit.  Appears well, in no apparent distress  ASSESSMENT: Vaginal swab for STD screen  PLAN: Self-collected vaginal probe for Gonorrhea, Chlamydia, Trichomonas, Bacterial Vaginosis, Yeast sent to lab Treatment: to be determined once results are received Follow-up as needed if symptoms persist/worsen, or new symptoms develop  Alan LITTIE Fischer  09/16/2024 10:27 AM

## 2024-09-17 LAB — CERVICOVAGINAL ANCILLARY ONLY
Bacterial Vaginitis (gardnerella): NEGATIVE
Candida Glabrata: NEGATIVE
Candida Vaginitis: NEGATIVE
Chlamydia: NEGATIVE
Comment: NEGATIVE
Comment: NEGATIVE
Comment: NEGATIVE
Comment: NEGATIVE
Comment: NEGATIVE
Comment: NORMAL
Neisseria Gonorrhea: NEGATIVE
Trichomonas: NEGATIVE

## 2024-09-21 ENCOUNTER — Ambulatory Visit: Payer: Self-pay | Admitting: Women's Health

## 2024-10-05 DIAGNOSIS — Z1231 Encounter for screening mammogram for malignant neoplasm of breast: Secondary | ICD-10-CM

## 2024-10-22 ENCOUNTER — Other Ambulatory Visit: Payer: Self-pay

## 2024-10-22 ENCOUNTER — Other Ambulatory Visit: Payer: Self-pay | Admitting: Internal Medicine

## 2024-10-22 DIAGNOSIS — R928 Other abnormal and inconclusive findings on diagnostic imaging of breast: Secondary | ICD-10-CM

## 2024-10-22 DIAGNOSIS — Z1231 Encounter for screening mammogram for malignant neoplasm of breast: Secondary | ICD-10-CM

## 2024-10-30 ENCOUNTER — Ambulatory Visit
Admission: RE | Admit: 2024-10-30 | Discharge: 2024-10-30 | Disposition: A | Source: Ambulatory Visit | Attending: Internal Medicine | Admitting: Internal Medicine

## 2024-10-30 ENCOUNTER — Other Ambulatory Visit: Payer: Self-pay | Admitting: Internal Medicine

## 2024-10-30 DIAGNOSIS — R928 Other abnormal and inconclusive findings on diagnostic imaging of breast: Secondary | ICD-10-CM

## 2024-10-30 DIAGNOSIS — N6310 Unspecified lump in the right breast, unspecified quadrant: Secondary | ICD-10-CM

## 2024-11-10 ENCOUNTER — Other Ambulatory Visit

## 2024-11-23 ENCOUNTER — Ambulatory Visit: Admitting: Adult Health
